# Patient Record
Sex: Male | Born: 1970 | Race: Black or African American | Hispanic: No | Marital: Single | Smoking: Never smoker
Health system: Southern US, Community
[De-identification: ages and names within clinical notes are randomized; demographics above are authoritative.]

## PROBLEM LIST (undated history)

## (undated) DIAGNOSIS — C254 Malignant neoplasm of endocrine pancreas: Secondary | ICD-10-CM

## (undated) DIAGNOSIS — N19 Unspecified kidney failure: Secondary | ICD-10-CM

## (undated) DIAGNOSIS — I10 Essential (primary) hypertension: Secondary | ICD-10-CM

## (undated) DIAGNOSIS — T7840XA Allergy, unspecified, initial encounter: Secondary | ICD-10-CM

## (undated) DIAGNOSIS — E119 Type 2 diabetes mellitus without complications: Secondary | ICD-10-CM

## (undated) DIAGNOSIS — G931 Anoxic brain damage, not elsewhere classified: Secondary | ICD-10-CM

## (undated) DIAGNOSIS — E785 Hyperlipidemia, unspecified: Secondary | ICD-10-CM

## (undated) DIAGNOSIS — C7A8 Other malignant neuroendocrine tumors: Secondary | ICD-10-CM

## (undated) DIAGNOSIS — D490 Neoplasm of unspecified behavior of digestive system: Secondary | ICD-10-CM

## (undated) DIAGNOSIS — L281 Prurigo nodularis: Secondary | ICD-10-CM

## (undated) HISTORY — DX: Allergy, unspecified, initial encounter: T78.40XA

## (undated) HISTORY — PX: EYE SURGERY: SHX253

## (undated) HISTORY — DX: Hyperlipidemia, unspecified: E78.5

---

## 2016-11-27 ENCOUNTER — Encounter (HOSPITAL_COMMUNITY): Payer: Self-pay | Admitting: Emergency Medicine

## 2016-11-27 ENCOUNTER — Emergency Department (HOSPITAL_COMMUNITY)
Admission: EM | Admit: 2016-11-27 | Discharge: 2016-11-28 | Disposition: A | Discharge status: Home / Self Care | Payer: BLUE CROSS/BLUE SHIELD | Attending: Emergency Medicine | Admitting: Emergency Medicine

## 2016-11-27 ENCOUNTER — Emergency Department (HOSPITAL_COMMUNITY): Payer: BLUE CROSS/BLUE SHIELD

## 2016-11-27 DIAGNOSIS — E1165 Type 2 diabetes mellitus with hyperglycemia: Secondary | ICD-10-CM | POA: Diagnosis not present

## 2016-11-27 DIAGNOSIS — R112 Nausea with vomiting, unspecified: Secondary | ICD-10-CM | POA: Insufficient documentation

## 2016-11-27 DIAGNOSIS — D490 Neoplasm of unspecified behavior of digestive system: Secondary | ICD-10-CM | POA: Insufficient documentation

## 2016-11-27 DIAGNOSIS — R748 Abnormal levels of other serum enzymes: Secondary | ICD-10-CM | POA: Diagnosis not present

## 2016-11-27 DIAGNOSIS — D631 Anemia in chronic kidney disease: Secondary | ICD-10-CM

## 2016-11-27 DIAGNOSIS — N186 End stage renal disease: Secondary | ICD-10-CM | POA: Diagnosis not present

## 2016-11-27 DIAGNOSIS — Z79899 Other long term (current) drug therapy: Secondary | ICD-10-CM | POA: Diagnosis not present

## 2016-11-27 DIAGNOSIS — Z794 Long term (current) use of insulin: Secondary | ICD-10-CM | POA: Diagnosis not present

## 2016-11-27 DIAGNOSIS — I120 Hypertensive chronic kidney disease with stage 5 chronic kidney disease or end stage renal disease: Secondary | ICD-10-CM | POA: Diagnosis not present

## 2016-11-27 DIAGNOSIS — Z992 Dependence on renal dialysis: Secondary | ICD-10-CM | POA: Diagnosis not present

## 2016-11-27 DIAGNOSIS — R059 Cough, unspecified: Secondary | ICD-10-CM

## 2016-11-27 DIAGNOSIS — Z659 Problem related to unspecified psychosocial circumstances: Secondary | ICD-10-CM

## 2016-11-27 DIAGNOSIS — R05 Cough: Secondary | ICD-10-CM | POA: Diagnosis not present

## 2016-11-27 DIAGNOSIS — E1122 Type 2 diabetes mellitus with diabetic chronic kidney disease: Secondary | ICD-10-CM | POA: Insufficient documentation

## 2016-11-27 HISTORY — DX: Unspecified kidney failure: N19

## 2016-11-27 HISTORY — DX: Neoplasm of unspecified behavior of digestive system: D49.0

## 2016-11-27 HISTORY — DX: Type 2 diabetes mellitus without complications: E11.9

## 2016-11-27 HISTORY — DX: Essential (primary) hypertension: I10

## 2016-11-27 LAB — CBC WITH DIFFERENTIAL/PLATELET
BASOS PCT: 0 %
Basophils Absolute: 0 10*3/uL (ref 0.0–0.1)
EOS ABS: 0.2 10*3/uL (ref 0.0–0.7)
EOS PCT: 3 %
HCT: 32.5 % — ABNORMAL LOW (ref 39.0–52.0)
Hemoglobin: 10.6 g/dL — ABNORMAL LOW (ref 13.0–17.0)
LYMPHS ABS: 1.8 10*3/uL (ref 0.7–4.0)
Lymphocytes Relative: 24 %
MCH: 26.7 pg (ref 26.0–34.0)
MCHC: 32.6 g/dL (ref 30.0–36.0)
MCV: 81.9 fL (ref 78.0–100.0)
MONOS PCT: 9 %
Monocytes Absolute: 0.7 10*3/uL (ref 0.1–1.0)
NEUTROS PCT: 64 %
Neutro Abs: 4.8 10*3/uL (ref 1.7–7.7)
PLATELETS: 207 10*3/uL (ref 150–400)
RBC: 3.97 MIL/uL — ABNORMAL LOW (ref 4.22–5.81)
RDW: 18.6 % — ABNORMAL HIGH (ref 11.5–15.5)
WBC: 7.5 10*3/uL (ref 4.0–10.5)

## 2016-11-27 LAB — COMPREHENSIVE METABOLIC PANEL
ALBUMIN: 3.2 g/dL — AB (ref 3.5–5.0)
ALT: 15 U/L — ABNORMAL LOW (ref 17–63)
ANION GAP: 15 (ref 5–15)
AST: 15 U/L (ref 15–41)
Alkaline Phosphatase: 336 U/L — ABNORMAL HIGH (ref 38–126)
BUN: 51 mg/dL — ABNORMAL HIGH (ref 6–20)
CHLORIDE: 99 mmol/L — AB (ref 101–111)
CO2: 23 mmol/L (ref 22–32)
Calcium: 7.5 mg/dL — ABNORMAL LOW (ref 8.9–10.3)
Creatinine, Ser: 6.31 mg/dL — ABNORMAL HIGH (ref 0.61–1.24)
GFR calc non Af Amer: 10 mL/min — ABNORMAL LOW (ref >60)
GFR, EST AFRICAN AMERICAN: 11 mL/min — AB (ref >60)
GLUCOSE: 209 mg/dL — AB (ref 65–99)
POTASSIUM: 3.9 mmol/L (ref 3.5–5.1)
SODIUM: 137 mmol/L (ref 135–145)
Total Bilirubin: 0.6 mg/dL (ref 0.3–1.2)
Total Protein: 7 g/dL (ref 6.5–8.1)

## 2016-11-27 LAB — AMMONIA: AMMONIA: 39 umol/L — AB (ref 9–35)

## 2016-11-27 LAB — PROTIME-INR
INR: 1.4
PROTHROMBIN TIME: 17.1 s — AB (ref 11.4–15.2)

## 2016-11-27 LAB — APTT: APTT: 33 s (ref 24–36)

## 2016-11-27 LAB — LIPASE, BLOOD: Lipase: 18 U/L (ref 11–51)

## 2016-11-27 LAB — ETHANOL

## 2016-11-27 MED ORDER — INSULIN DETEMIR 100 UNIT/ML ~~LOC~~ SOLN
10.0000 [IU] | SUBCUTANEOUS | Status: AC
  Administered 2016-11-28: 10 [IU] via SUBCUTANEOUS
  Filled 2016-11-27: qty 0.1

## 2016-11-27 MED ORDER — NIACIN 500 MG PO TABS
500.0000 mg | ORAL_TABLET | Freq: Every day | ORAL | Status: DC
  Administered 2016-11-28: 500 mg via ORAL
  Filled 2016-11-27: qty 1

## 2016-11-27 MED ORDER — INSULIN DETEMIR 100 UNIT/ML ~~LOC~~ SOLN
15.0000 [IU] | SUBCUTANEOUS | Status: DC
  Administered 2016-11-28: 15 [IU] via SUBCUTANEOUS
  Filled 2016-11-27: qty 0.15

## 2016-11-27 MED ORDER — VITAMIN B-1 100 MG PO TABS
100.0000 mg | ORAL_TABLET | Freq: Every day | ORAL | Status: DC
  Administered 2016-11-28: 100 mg via ORAL
  Filled 2016-11-27: qty 1

## 2016-11-27 MED ORDER — OCTREOTIDE ACETATE 100 MCG/ML IJ SOLN
100.0000 ug | Freq: Three times a day (TID) | INTRAMUSCULAR | Status: DC
  Administered 2016-11-28 (×2): 100 ug via SUBCUTANEOUS
  Filled 2016-11-27 (×3): qty 1

## 2016-11-27 MED ORDER — INSULIN DETEMIR 100 UNIT/ML ~~LOC~~ SOLN
10.0000 [IU] | SUBCUTANEOUS | Status: DC
  Filled 2016-11-27: qty 0.1

## 2016-11-27 MED ORDER — AMLODIPINE BESYLATE 5 MG PO TABS
10.0000 mg | ORAL_TABLET | Freq: Every day | ORAL | Status: DC
  Administered 2016-11-28: 10 mg via ORAL
  Filled 2016-11-27: qty 2

## 2016-11-27 MED ORDER — RENA-VITE PO TABS
1.0000 | ORAL_TABLET | Freq: Every day | ORAL | Status: DC
  Administered 2016-11-28: 1 via ORAL
  Filled 2016-11-27: qty 1

## 2016-11-27 MED ORDER — METOPROLOL TARTRATE 25 MG PO TABS
100.0000 mg | ORAL_TABLET | Freq: Two times a day (BID) | ORAL | Status: DC
  Administered 2016-11-28 (×2): 100 mg via ORAL
  Filled 2016-11-27 (×2): qty 4

## 2016-11-27 MED ORDER — CLONIDINE HCL 0.1 MG PO TABS
0.2000 mg | ORAL_TABLET | Freq: Two times a day (BID) | ORAL | Status: DC
  Administered 2016-11-28 (×2): 0.2 mg via ORAL
  Filled 2016-11-27 (×2): qty 2

## 2016-11-27 MED ORDER — PANCRELIPASE (LIP-PROT-AMYL) 12000-38000 UNITS PO CPEP
24000.0000 [IU] | ORAL_CAPSULE | Freq: Three times a day (TID) | ORAL | Status: DC
  Administered 2016-11-28 (×2): 24000 [IU] via ORAL
  Filled 2016-11-27 (×4): qty 2

## 2016-11-27 MED ORDER — CHOLESTYRAMINE 4 G PO PACK
4.0000 g | PACK | Freq: Two times a day (BID) | ORAL | Status: DC
  Administered 2016-11-28 (×2): 4 g via ORAL
  Filled 2016-11-27 (×2): qty 1

## 2016-11-27 MED ORDER — INSULIN ASPART 100 UNIT/ML ~~LOC~~ SOLN
0.0000 [IU] | Freq: Three times a day (TID) | SUBCUTANEOUS | Status: DC
Start: 2016-11-28 — End: 2016-11-28

## 2016-11-27 MED ORDER — PANTOPRAZOLE SODIUM 40 MG PO TBEC
40.0000 mg | DELAYED_RELEASE_TABLET | Freq: Every day | ORAL | Status: DC
  Administered 2016-11-28: 40 mg via ORAL
  Filled 2016-11-27: qty 1

## 2016-11-27 MED ORDER — FA-PYRIDOXINE-CYANOCOBALAMIN 2.5-25-2 MG PO TABS
1.0000 | ORAL_TABLET | Freq: Every day | ORAL | Status: DC
  Administered 2016-11-28: 1 via ORAL
  Filled 2016-11-27: qty 1

## 2016-11-27 MED ORDER — LOSARTAN POTASSIUM 50 MG PO TABS
50.0000 mg | ORAL_TABLET | Freq: Every day | ORAL | Status: DC
  Administered 2016-11-28: 50 mg via ORAL
  Filled 2016-11-27: qty 1

## 2016-11-27 NOTE — ED Provider Notes (Signed)
   Face-to-face evaluation   History: Patient was brought here by his sister, because she was reportedly told to bring him for care because of his debilitated state.  Patient reports he moved to [place] 2 days ago, from [place].  He was able to get dialyzed today.  Physical exam: Debilitated, appears older than stated age.  Marked abdominal distention consistent with ascites.  Bruising and abrasions, knees bilaterally.  Mild dysarthria, nonspecific.  Medical screening examination/treatment/procedure(s) were conducted as a shared visit with non-physician practitioner(s) and myself.  I personally evaluated the patient during the encounter   Daleen Bo, MD 11/27/16 [phone]

## 2016-11-27 NOTE — ED Notes (Signed)
Bed: WA31 Expected date:  Expected time:  Means of arrival:  Comments: 

## 2016-11-27 NOTE — ED Provider Notes (Signed)
[facility] DEPT Provider Note   CSN: [id number] Arrival date & time: 11/27/16  1825     History   Chief Complaint Chief Complaint  Patient presents with  . Dialysis/Placement    HPI Philip West is a 46 y.o. male with a PMHx of DM2, HTN, ESRD on dialysis M/W/F, and pancreatic tumor (unclear, states "no that spot went away"), who presents to the ED unaccompanied apparently for complaints of "I guess I'm homeless". LEVEL 5 CAVEAT DUE TO POOR HISTORIAN AND LACK OF A CAREGIVER. Patient states that he just recently moved here from [place], and just started on dialysis today. He states that he has a hard time getting up the stairs at his sister's house and because of that his dialysis center reportedly threatened to call social services on his sister and less she got him placed in a facility. Patient states that his sister is able to take care of him, but he can't get up the stairs. He uses a walker to get around some times, but usually just uses his wheelchair for ease. He doesn't know where he goes to dialysis because he just started going today. He does not have a primary care doctor here, he was previously being seen by Dr. Antoine Primas (?) in [place]. He states that he did start having a dry cough 2 days ago, and had some nausea and 3 episodes of nonbloody nonbilious emesis earlier today however he denies any ongoing nausea or vomiting. He has not taken any medications for his symptoms, no known aggravating factors. He does not make urine secondary to his ESRD. His sister's name is Armed forces technical officer and her phone number is [phone]. Patient denies any fevers, chills, CP, SOB, abdominal pain, hematemesis, melena, hematochezia, diarrhea, constipation, myalgias, arthralgias, numbness, tingling, focal weakness, or any other complaints at this time. Of note, he states that he always has some abdominal distention, this is not new. Denies having any other medical conditions aside from those listed above,  but it's unclear if his history is accurate as he is a very poor historian.    The history is provided by the patient and medical records. The history is limited by the absence of a caregiver. No language interpreter was used.  Cough  This is a new problem. The current episode started 2 days ago. The problem occurs constantly. The problem has not changed since onset.The cough is non-productive. There has been no fever. Pertinent negatives include no chest pain, no chills, no myalgias and no shortness of breath. He has tried nothing for the symptoms. The treatment provided no relief. He is not a smoker.    Past Medical History:  Diagnosis Date  . Diabetes mellitus without complication ([place])   . Hypertension   . Kidney failure   . Pancreatic tumor     There are no active problems to display for this patient.   History reviewed. No pertinent surgical history.     Home Medications    Prior to Admission medications   Medication Sig Start Date End Date Taking? Authorizing Provider  acetaminophen (TYLENOL) 325 MG tablet Take 650 mg by mouth as directed. Take 30 minutes prior to treatment   Yes [provider]  amLODipine (NORVASC) 10 MG tablet Take 10 mg by mouth daily.   Yes [provider]  b complex-vitamin c-folic acid (NEPHRO-VITE) 0.8 MG TABS tablet Take 1 tablet by mouth daily.   Yes [provider]  cholestyramine (QUESTRAN) 4 g packet Take 4 g by  mouth 2 (two) times daily.   Yes [provider]  cloNIDine (CATAPRES) 0.2 MG tablet Take 0.2 mg by mouth 2 (two) times daily. Hold if sbp less than 110   Yes [provider]  Cyanocobalamin (VITAMIN B 12 PO) Take 1 tablet by mouth daily.   Yes [provider]  folic acid-pyridoxine-cyancobalamin (FOLTX) 2.5-25-2 MG TABS tablet Take 1 tablet by mouth daily.   Yes [provider]  insulin detemir (LEVEMIR) 100 UNIT/ML injection Inject 10-15 Units into the skin 2 (two) times  daily. 15 units at 6 am and 10 units at 6 pm   Yes [provider]  insulin lispro (HUMALOG) 100 UNIT/ML injection Inject 0-12 Units into the skin 2 (two) times daily. Per sliding scale    Yes [provider]  lipase/protease/amylase (CREON) 12000 units CPEP capsule Take 24,000 Units by mouth 3 (three) times daily with meals.   Yes [provider]  losartan (COZAAR) 50 MG tablet Take 50 mg by mouth daily.   Yes [provider]  metoprolol tartrate (LOPRESSOR) 100 MG tablet Take 100 mg by mouth 2 (two) times daily.   Yes [provider]  niacin 500 MG tablet Take 500 mg by mouth at bedtime.   Yes [provider]  octreotide (SANDOSTATIN) 100 MCG/ML SOLN injection Inject 100 mcg into the skin 3 (three) times daily.   Yes [provider]  pantoprazole (PROTONIX) 40 MG tablet Take 40 mg by mouth daily.   Yes [provider]  thiamine (VITAMIN B-1) 100 MG tablet Take 100 mg by mouth daily.   Yes [provider]    Family History History reviewed. No pertinent family history.  Social History Social History  Substance Use Topics  . Smoking status: Never Smoker  . Smokeless tobacco: Never Used  . Alcohol use No     Allergies   Patient has no known allergies.   Review of Systems Review of Systems  Unable to perform ROS: Other  Constitutional: Negative for chills and fever.  Respiratory: Positive for cough. Negative for shortness of breath.   Cardiovascular: Negative for chest pain.  Gastrointestinal: Positive for nausea and vomiting. Negative for abdominal pain, blood in stool, constipation and diarrhea.  Musculoskeletal: Negative for arthralgias and myalgias.  Allergic/Immunologic: Positive for immunocompromised state (DM2).  Neurological: Negative for weakness and numbness.   Level 5 caveat due to poor historian and absence of caregiver    Physical Exam Updated Vital Signs BP (!) 152/125 (BP Location:  Left Arm)   Pulse 90   Temp (!) 97.4 F (36.3 C) (Oral)   Resp 18   Ht 6' (1.829 m)   Wt 86.6 kg (191 lb)   SpO2 100%   BMI 25.90 kg/m   Physical Exam  Constitutional: He is oriented to person, place, and time. Vital signs are normal. He appears well-developed and well-nourished.  Non-toxic appearance. No distress.  Afebrile, nontoxic, NAD, laying in bed; mildly hypertensive  HENT:  Head: Normocephalic and atraumatic.  Mouth/Throat: Oropharynx is clear and moist and mucous membranes are normal.  Eyes: Pupils are equal, round, and reactive to light. Conjunctivae and EOM are normal. Right eye exhibits no discharge. Left eye exhibits no discharge.  PERRL, EOMI, IOLs noted in pupils bilaterally  Neck: Normal range of motion. Neck supple.  Cardiovascular: Normal rate, regular rhythm, normal heart sounds and intact distal pulses.  Exam reveals no gallop and no friction rub.   No murmur heard. Pulmonary/Chest: Effort normal  and breath sounds normal. No respiratory distress. He has no decreased breath sounds. He has no wheezes. He has no rhonchi. He has no rales.  CTAB in all lung fields, no w/r/r, no hypoxia or increased WOB, speaking in full sentences, SpO2 100% on RA R upper chest with dialysis catheter, site appears clean and dry, dressing intact.   Abdominal: Soft. Normal appearance and bowel sounds are normal. He exhibits distension. There is no tenderness. There is no rigidity, no rebound, no guarding, no CVA tenderness, no tenderness at McBurney's point and negative Murphy's sign.  Soft, mildly distended appearing, +BS throughout, with no TTP, no r/g/r, neg murphy's, neg mcburney's, no CVA TTP   Musculoskeletal: Normal range of motion.  MAE x4 Strength and sensation grossly intact in all extremities Distal pulses intact No pedal edema, neg homan's bilaterally   Neurological: He is alert and oriented to person, place, and time. He has normal strength. No sensory deficit.  A&O x3  however hard to tell if his history is accurate; oriented to year but not to month Stutters  Skin: Skin is warm, dry and intact. No rash noted.  Psychiatric: He has a normal mood and affect.  Nursing note and vitals reviewed.    ED Treatments / Results  Labs (all labs ordered are listed, but only abnormal results are displayed) Labs Reviewed  CBC WITH DIFFERENTIAL/PLATELET - Abnormal; Notable for the following:       Result Value   RBC 3.97 (*)    Hemoglobin 10.6 (*)    HCT 32.5 (*)    RDW 18.6 (*)    All other components within normal limits  COMPREHENSIVE METABOLIC PANEL - Abnormal; Notable for the following:    Chloride 99 (*)    Glucose, Bld 209 (*)    BUN 51 (*)    Creatinine, Ser 6.31 (*)    Calcium 7.5 (*)    Albumin 3.2 (*)    ALT 15 (*)    Alkaline Phosphatase 336 (*)    GFR calc non Af Amer 10 (*)    GFR calc Af Amer 11 (*)    All other components within normal limits  AMMONIA - Abnormal; Notable for the following:    Ammonia 39 (*)    All other components within normal limits  PROTIME-INR - Abnormal; Notable for the following:    Prothrombin Time 17.1 (*)    All other components within normal limits  LIPASE, BLOOD  APTT  ETHANOL    EKG  EKG Interpretation  Date/Time:  Tuesday November 27 2016 21:35:58 EDT Ventricular Rate:  92 PR Interval:    QRS Duration: 110 QT Interval:  395 QTC Calculation: 489 R Axis:   145 Text Interpretation:  Sinus rhythm Probable lateral infarct, age indeterminate No old tracing to compare Confirmed by Daleen Bo [phone]) on 11/27/2016 10:24:18 PM       Radiology Dg Abd Acute W/chest  Result Date: 11/27/2016 CLINICAL DATA:  Abdominal distention and cough for 2 days. EXAM: DG ABDOMEN ACUTE W/ 1V CHEST COMPARISON:  None. FINDINGS: Right jugular central line extends to the right atrium. Moderate enlargement of the cardiac silhouette. Small pleural effusions. No airspace consolidation. Supine and left lateral decubitus  views of the abdomen demonstrated a generous volume of air throughout unobstructed bowel. No evidence of bowel obstruction or perforation. No biliary or urinary calculi. IMPRESSION: Enlarged cardiac silhouette. Small pleural effusions. No acute findings in the abdomen. Electronically Signed   By: Valerie Roys.D.  On: 11/27/2016 21:14    Procedures Procedures (including critical care time)  Medications Ordered in ED Medications  amLODipine (NORVASC) tablet 10 mg (not administered)  multivitamin (RENA-VIT) tablet 1 tablet (not administered)  cholestyramine (QUESTRAN) packet 4 g (not administered)  cloNIDine (CATAPRES) tablet 0.2 mg (not administered)  folic acid-pyridoxine-cyancobalamin (FOLTX) 2.5-25-2 MG per tablet 1 tablet (not administered)  insulin detemir (LEVEMIR) injection 10-15 Units (not administered)  lipase/protease/amylase (CREON) capsule 24,000 Units (not administered)  losartan (COZAAR) tablet 50 mg (not administered)  metoprolol tartrate (LOPRESSOR) tablet 100 mg (not administered)  niacin tablet 500 mg (not administered)  octreotide (SANDOSTATIN) injection 100 mcg (not administered)  pantoprazole (PROTONIX) EC tablet 40 mg (not administered)  thiamine (VITAMIN B-1) tablet 100 mg (not administered)  insulin aspart (novoLOG) injection 0-9 Units (not administered)     Initial Impression / Assessment and Plan / ED Course  I have reviewed the triage vital signs and the nursing notes.  Pertinent labs & imaging results that were available during my care of the patient were reviewed by me and considered in my medical decision making (see chart for details).     46 y.o. male here because he states he's homeless because his dialysis center threatened to call social services on his sister due to pt not being able to get up the stairs in her house. Very difficult historian, unable to provide details very well. States he got dialysis today but doesn't know where he gets it  because he just moved here and today was the first day he had dialysis. Reports cough x 2 days and n/v this morning which resolved. A&O x3 however unclear if his history is completely accurate. On exam, no abdominal tenderness but abdomen appears distended which pt states is chronic; lungs clear, mild HTN but otherwise VSS. Attempted to call his sister Philip West at [phone] but she didn't answer; left voice mail to call me back so we can try to gather more information. In the meantime, will attempt to get labs and acute abd series to evaluate for any acute condition. Will reassess shortly.   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 Passed out in February 2018, down for 5 days, found unresponsive and ill; that's when he was diagnosed with this pancreatic issue, and when he started having kidney failure. No CVA at that time.  -Moved here because he wanted to expedite the kidney transplant process -Confirms he has some abd distension ever since February 2018, related to pancreas and kidney issues -States he can't get down stairs, someone has to carry him down -She states he's at his baseline now, as he's been since June, no new illnesses; confirms he had dialysis today at [facility] on [address]. States they were concerned that he can't get down the stairs on his own and threatened to call social services on them unless they brought him to the ER for nursing home placement.  -Confirms he has had a cough for 2 days which she thought was due to her cat and pt having asthma/allergies. She will be available if we need anything further. Advised that we will look for signs of infection/indication for admission, and get social work involved to see what we can do. Will continue to monitor and reassess shortly.   10:32 PM CBC w/diff with anemia, unclear what his baseline is, but likely due to anemia of chronic renal disease. CMP with glucose 209  but no anion gap, Cr  6.31 and BUN 51 again unclear baseline; alk phos 336 but unclear if this is baseline. Lipase WNL. Ammonia level mildly elevated at 39. APTT WNL. INR slightly elevated. EtOH level undetectable. EKG without acute ischemic findings. Acute abd series with enlarged cardiac silhouette and small pleural effusions, but otherwise no acute findings in abdomen (some gas in unobstructed bowel). No medical indication to admit pt, we could send him home with home health nurse eval tomorrow however sister states she does not feel comfortable taking him home and does not feel this solution works. Will try to get in touch with social work, but ultimately may need to have pt stay here overnight and await placement with social work help overnight/tomorrow. Will get ahold of Education officer, museum now.   10:41 PM Shelby with social work returning page, states they'd need to verify his insurance tomorrow and try to get placed to a facility; wants PT/OT consultation done so that we can start that process, and in the morning Erin from social work will follow up with this case and try to get this situation remedied.   11:00 PM Home med orders placed, along with SSI. Patient signed out to overnight provider Dr. Leonides Schanz. Patient history has been discussed with provider resuming care. Please see their notes for further documentation of pending results and dispo/care. Pt stable at sign-out, pending AM social work eval.    Final Clinical Impressions(s) / ED Diagnoses   Final diagnoses:  ESRD on dialysis ([facility])  Anemia due to chronic kidney disease, on chronic dialysis ([place])  Elevated alkaline phosphatase level  Type 2 diabetes mellitus with hyperglycemia, with long-term current use of insulin ([facility])  Nausea and vomiting in adult patient  Cough  Poor social situation    New Prescriptions New Prescriptions   No medications on [address] [place], [place] 11/27/16 2306    Daleen Bo, MD 11/27/16  [phone]

## 2016-11-27 NOTE — ED Notes (Addendum)
CSW consulted for placement @ 6222LN; pt unable to walk and lives with his sister who cannot care for him. It is unclear what ins pt has at this time, facesheet does not indicate any ins. Pt reporting [employer] as his insurance. NP reports pt is walker/wheelchair bound. NP will put in consult for PT/OT Evals.  CSW will continue to follow for DC planning.  Rozanne Heumann B. Joline Maxcy Clinical Social Work Dept Weekend Social Worker [phone] 10:43 PM

## 2016-11-27 NOTE — ED Notes (Signed)
Patient transported to X-ray 

## 2016-11-27 NOTE — ED Triage Notes (Addendum)
Patient is coming to the ED due to he is on dialysis and can not take care of himself. Patient living with sister but her place is not equipped to take care of him. Dialysis center told her today that they were going to call social services on her if she was the one to bring him to dialysis. Sister told them that she is the only one to care for him and they told her to bring him to the hospital to be placed. Patient has dialysis is on Monday, Wednesday, and Friday.

## 2016-11-28 LAB — CBG MONITORING, ED
GLUCOSE-CAPILLARY: 89 mg/dL (ref 65–99)
GLUCOSE-CAPILLARY: 89 mg/dL (ref 65–99)

## 2016-11-28 NOTE — Evaluation (Signed)
Physical Therapy Evaluation Patient Details Name: Philip West MRN: [id number] DOB: April 21, 1970 Today's Date: 11/28/2016   History of Present Illness  46 yo malwe on HD,  Brought to [place] from [place] by his previous caregiver/friend/POA, , two days ago.  Pt flew with [facility] using his wheelchair.  .  Pt's sister states pt was previous living with Darlene's elderly aunt and uncle in their 60s, who dropped the pt regularly, after he was D/C from SNF rehab due to lack of progress with PT/OT.  Sister states she attempted to get all of the information from [facility], finances, plan of care, etc before he came to [facility] but was unable to get any answers.  Chantelle thinks the pt gets LTD from his previous employer but is not sure.  Pt is currently being "put over someone's shoulder" to take him up the stairs to get to sisters 2nd floor apartment  Clinical Impression  The patient is very lethargic. Apparanatly, non ambulatory. Sister came in and transferred patient to [facility]. Reports that he is going to HD. The patient is quite debilitated Pt admitted with above diagnosis. Pt currently with functional limitations due to the deficits listed below (see PT Problem List).  Pt will benefit from skilled PT to increase their independence and safety with mobility to allow discharge to the venue listed below.       Follow Up Recommendations SNF    Equipment Recommendations  None recommended by PT    Recommendations for Other Services       Precautions / Restrictions Precautions Precautions: Fall      Mobility  Bed Mobility Overal bed mobility: Needs Assistance Bed Mobility: Supine to Sit     Supine to sit: Mod assist     General bed mobility comments: much extra time  and multimodal cues  to mobilize to bed edge.  Transfers Overall transfer level: Needs assistance   Transfers: Squat Pivot Transfers     Squat pivot transfers: Max assist     General transfer comment: sister came in and transferred  the patient intomhis WC from the bead with bearhugger approach., no weight noted on the patient's legs.  Ambulation/Gait                Stairs            Wheelchair Mobility    Modified Rankin (Stroke Patients Only)       Balance Overall balance assessment: History of Falls;Needs assistance Sitting-balance support: Feet supported;Bilateral upper extremity supported Sitting balance-Leahy Scale: Poor                                       Pertinent Vitals/Pain Pain Assessment: Faces Faces Pain Scale: No hurt    Home Living Family/patient expects to be discharged to:: Unsure Living Arrangements: Other relatives             Home Equipment: Wheelchair - manual      Prior Function Level of Independence: Needs assistance   Gait / Transfers Assistance Needed: non ambulatory, WC level with assisy           Hand Dominance        Extremity/Trunk Assessment        Lower Extremity Assessment Lower Extremity Assessment: RLE deficits/detail;LLE deficits/detail RLE Deficits / Details: does not bear weight on the legs when transferred    Cervical / Trunk Assessment Cervical / Trunk  Assessment: Kyphotic  Communication   Communication: Other (comment) (minimal responses)  Cognition Arousal/Alertness: Lethargic Behavior During Therapy: Flat affect Overall Cognitive Status: Difficult to assess                                        General Comments      Exercises     Assessment/Plan    PT Assessment Patient needs continued PT services  PT Problem List Decreased strength;Decreased activity tolerance;Decreased range of motion;Decreased balance;Decreased mobility;Decreased knowledge of precautions;Decreased safety awareness;Decreased knowledge of use of DME;Decreased cognition       PT Treatment Interventions Functional mobility training;Therapeutic activities;Patient/family education    PT Goals (Current goals can  be found in the Care Plan section)  Acute Rehab PT Goals Patient Stated Goal: he's going to HD and then I don't know where he'll go. PT Goal Formulation: With family Time For Goal Achievement: 12/12/16 Potential to Achieve Goals: Fair    Frequency Min 2X/week   Barriers to discharge Decreased caregiver support;Inaccessible home environment      Co-evaluation               AM-PAC PT "6 Clicks" Daily Activity  Outcome Measure Difficulty turning over in bed (including adjusting bedclothes, sheets and blankets)?: Unable Difficulty moving from lying on back to sitting on the side of the bed? : Unable Difficulty sitting down on and standing up from a chair with arms (e.g., wheelchair, bedside commode, etc,.)?: Unable Help needed moving to and from a bed to chair (including a wheelchair)?: Total Help needed walking in hospital room?: Total Help needed climbing 3-5 steps with a railing? : Total 6 Click Score: 6    End of Session   Activity Tolerance: Patient limited by fatigue;Patient limited by lethargy Patient left: in chair;with family/visitor present;with nursing/sitter in room Nurse Communication: Mobility status PT Visit Diagnosis: Repeated falls (R29.6)    Time: 1300-1316 PT Time Calculation (min) (ACUTE ONLY): 16 min   Charges:   PT Evaluation $PT Eval Low Complexity: 1 Low     PT G Codes:   PT G-Codes **NOT FOR INPATIENT CLASS** Functional Assessment Tool Used: Clinical judgement;AM-PAC 6 Clicks Basic Mobility Functional Limitation: Mobility: Walking and moving around Mobility: Walking and Moving Around Current Status (B2620): At least 80 percent but less than 100 percent impaired, limited or restricted Mobility: Walking and Moving Around Goal Status [phone]): At least 20 percent but less than 40 percent impaired, limited or restricted    [facility] PT [phone]  Claretha Cooper 11/28/2016, 1:41 PM

## 2016-11-28 NOTE — ED Provider Notes (Addendum)
Well-appearing.  No indication for emergent dialysis now.  Stable vital signs.  Discharged home in the care of family.  Social work and case management involved.  Home health resources including home health social worker.  He'll go to his normal outpatient dialysis center for dialysis.  He'll continue to work with home health about possible placement.  No indication for acute hospitalization.  Medically stable.     Durable Medical Equipment        Start     Ordered   11/28/16 0000  For home use only DME Walker rolling    Question:  Patient needs a walker to treat with the following condition  Answer:  Immobility   11/28/16 1232   11/28/16 0000  For home use only DME 3 n 1     11/28/16 1232   11/28/16 0000  DME Hospital bed    Question Answer Comment  Patient has (list medical condition): ESRD   The above medical condition requires: Patient requires the ability to reposition frequently   Bed type Semi-electric      11/28/16 [place], Thao Bauza, MD 11/28/16 [place], Shelisha Gautier, MD 11/28/16 1232

## 2016-11-28 NOTE — Progress Notes (Signed)
CSW spoke with patients sister, [name] [phone], at length regarding concerns with placement/ home at this time. Patients sister state patient moved to [place] 2 days ago to receive additional care from her and to potentially expedite a kidney transplant. Sister states the social worker at his dialysis center voiced concerns due to patient being unable to walk down her stair- sister has been having someone carry him down the stairs for appointments. Per sister, the social worker at the dialysis center stated she would file a DSS report due patient "not being able to get down the stairs" and "needing to be in long-term care". After this conversation, patients sister brought patient to the ED to assist with placement.   Per patients sister, patient has [place] however this is no indicated on facesheet. Due to patient needing dialysis every M/W/F patient would need to discharge back home with [facility] services. Patient is unable to be transported from ED for dialysis appointments at this time. CSW spoke with EDP regarding patient and current home situation who agrees with decision. EDP spoke with patients sister at length regarding patients need to return home at this time to continue with dialysis and placement can be completed from home. CSW will reach out to RN CM and continue to follow for needs.   Kingsley Spittle, [facility] Emergency Room Clinical Social Worker [phone]

## 2016-11-28 NOTE — Evaluation (Signed)
Occupational Therapy Evaluation Patient Details Name: Philip West MRN: [id number] DOB: 22-Jan-1971 Today's Date: 11/28/2016    History of Present Illness 46 yo male on HD,  Brought to [place] from [place] by his previous caregiver/friend/POA, , two days prior to admission.  Pt flew with [place] using his wheelchair.  .  Pt's sister states pt was previous living with Darlene's elderly aunt and uncle in their 20s, who dropped the pt regularly, after he was D/C from SNF rehab due to lack of progress with PT/OT.  Sister states she attempted to get all of the information from [place], finances, plan of care, etc before he came to [facility] but was unable to get any answers. Pt is currently being "put over someone's shoulder" to take him up the stairs to get to sisters 2nd floor apartment   Clinical Impression   This 46 year old man was admitted for the above. He needed extensive assistance for adls prior to admission.  As noted above, pt was carried down a flight of stairs to get to dialysis.  Will follow pt in acute setting with set up goals for UB, mod A for LB and lateral transfers to drop arm commode.  Pt currently needs min for UB and max to total A for LB ADLs    Follow Up Recommendations  SNF    Equipment Recommendations   (possibly drop arm commode)    Recommendations for Other Services       Precautions / Restrictions Precautions Precautions: Fall Precaution Comments: slow to initiate Restrictions Weight Bearing Restrictions: No      Mobility Bed Mobility Overal bed mobility: Needs Assistance Bed Mobility: Supine to Sit     Supine to sit: Mod assist     General bed mobility comments: much extra time  and multimodal cues  to mobilize to bed edge.  Transfers Overall transfer level: Needs assistance   Transfers: Squat Pivot Transfers     Squat pivot transfers: Max assist     General transfer comment: sister came in and transferred the patient intomhis WC from the bead with  bearhugger approach., no weight noted on the patient's legs.    Balance Overall balance assessment: History of Falls;Needs assistance Sitting-balance support: Feet supported;Bilateral upper extremity supported Sitting balance-Leahy Scale: Poor                                     ADL either performed or assessed with clinical judgement   ADL Overall ADL's : Needs assistance/impaired Eating/Feeding: Independent   Grooming: Set up;Sitting   Upper Body Bathing: Minimal assistance;Sitting   Lower Body Bathing: Maximal assistance;Bed level   Upper Body Dressing : Minimal assistance;Sitting   Lower Body Dressing: Total assistance;Bed level   Toilet Transfer: Maximal assistance;Squat-pivot;+2 for safety/equipment (w/c)   Toileting- Clothing Manipulation and Hygiene: Maximal assistance;Sitting/lateral lean         General ADL Comments: assisted pt to edge of bed and was preparing for transfer to recliner. Sister arrived stating that she was late for picking up pt to take him to dialysis.  She assisted him to w/c with therapist turning his hips.  Pt can be assisted with +1 at bed level for adls; needs +2 for transfers     Vision         Perception     Praxis      Pertinent Vitals/Pain Pain Assessment: No/denies pain Faces Pain Scale:  No hurt     Hand Dominance     Extremity/Trunk Assessment Upper Extremity Assessment Upper Extremity Assessment: Generalized weakness   Lower Extremity Assessment Lower Extremity Assessment: RLE deficits/detail;LLE deficits/detail RLE Deficits / Details: does not bear weight on the legs when transferred   Cervical / Trunk Assessment Cervical / Trunk Assessment: Kyphotic   Communication Communication Communication:  (little verbalization)   Cognition Arousal/Alertness: Lethargic Behavior During Therapy: Flat affect Overall Cognitive Status: Difficult to assess                                 General  Comments: Pt slow to respond/initiate.  Little verbalization   General Comments       Exercises     Shoulder Instructions      Home Living Family/patient expects to be discharged to:: Unsure Living Arrangements: Other relatives                           Home Equipment: Wheelchair - manual          Prior Functioning/Environment Level of Independence: Needs assistance  Gait / Transfers Assistance Needed: non ambulatory, WC level with assisy ADL's / Homemaking Assistance Needed: assist for most adls            OT Problem List: Decreased strength;Decreased activity tolerance;Impaired balance (sitting and/or standing);Decreased knowledge of use of DME or AE;Pain      OT Treatment/Interventions: Self-care/ADL training;Therapeutic exercise;DME and/or AE instruction;Therapeutic activities;Balance training;Patient/family education    OT Goals(Current goals can be found in the care plan section) Acute Rehab OT Goals Patient Stated Goal: he's going to HD and then I don't know where he'll go. OT Goal Formulation: With patient/family Time For Goal Achievement: 12/05/16 Potential to Achieve Goals: Fair ADL Goals Pt Will Transfer to Toilet: with mod assist;bedside commode (lateral transfer) Additional ADL Goal #1: pt will perform UB adls from supported sitting with set up Additional ADL Goal #2: pt will perform LB adls with mod A sit to lean using ae as needed Additional ADL Goal #3: pt will perform 10 reps x 3 shoulder motions AROM to increase strength for adls  OT Frequency: Min 2X/week   Barriers to D/C:            Co-evaluation PT/OT/SLP Co-Evaluation/Treatment: Yes Reason for Co-Treatment: For patient/therapist safety PT goals addressed during session: Mobility/safety with mobility OT goals addressed during session: ADL's and self-care      AM-PAC PT "6 Clicks" Daily Activity     Outcome Measure Help from another person eating meals?: None Help from  another person taking care of personal grooming?: A Little Help from another person toileting, which includes using toliet, bedpan, or urinal?: A Lot Help from another person bathing (including washing, rinsing, drying)?: A Lot Help from another person to put on and taking off regular upper body clothing?: A Little Help from another person to put on and taking off regular lower body clothing?: A Lot 6 Click Score: 16   End of Session    Activity Tolerance:  (limited by appointment) Patient left: in chair;with family/visitor present  OT Visit Diagnosis: Muscle weakness (generalized) (M62.81)                Time: 1300-1316 OT Time Calculation (min): 16 min Charges:  OT General Charges $OT Visit: 1 Visit OT Evaluation $OT Eval Low Complexity: 1 Low G-Codes: OT G-codes **  NOT FOR INPATIENT CLASS** Functional Assessment Tool Used: Clinical judgement Functional Limitation: Self care Self Care Current Status (I3437): At least 80 percent but less than 100 percent impaired, limited or restricted Self Care Goal Status (D5789): At least 40 percent but less than 60 percent impaired, limited or restricted   Lesle Chris, OTR/L [phone] 11/28/2016  Skyy Nilan 11/28/2016, 1:58 PM

## 2016-11-28 NOTE — Care Management Note (Addendum)
Case Management Note  Patient Details  Name: Philip West MRN: [id number] Date of Birth: 07-27-1970  Subjective/Objective:                  46 y.o male with a PMHx of DM2, HTN, ESRD on dialysis M/W/F, and pancreatic tumor brought in by pt's sister, [name] [phone], and current caregiver.    Action/Plan: Pt is in need of a higher level of care and is a poor historian.  Brought to [place] from [place] by his previous caregiver/friend/POA, [place], [phone], two days ago.  Pt flew with [place] using his wheelchair.  No other equipment was brought with pt.  Pt's sister states pt was previous living with Darlene's elderly aunt and uncle in their 19s, who dropped the pt regularly, after he was D/C from SNF rehab due to lack of progress with PT/OT.  Sister states she attempted to get all of the information from [place], finances, plan of care, etc before he came to [facility] but was unable to get any answers.  Chantelle thinks the pt gets LTD from his previous employer but is not sure.  Pt is currently being "put over someone's shoulder" to take him up the stairs to get to sisters 2nd floor apartment.  Sister states she is now unsure if pt is going to stay in [place] or go back to [place].  It appears through that sisters goal is for pt to be placed through PCP and with assistance from [facility] CSW in an appropriate level of care facility, either in [place] or in [place].   Discussed a 3-in-1, hospital bed, and walker for pt's use.  The apartment has carpet, so a hoyer lift would not work in this setting.  Sister states she would like to use [facility] for HHS.  Orders placed for DME and HH RN, PT, OT, NA, and SW.  DME will be delivered today to the home.  Discussed with sister multiple providers that go to pt's home to establish PCP care with.  She will follow up with [employer] to see if any of them are in-network for the pt to start seeing.  Confirmed that Dr Jamal Maes, Internal Medicine - Nephrologist, will sign HHS orders until  pt can establish a PCP.  All information placed on AVS and fully discussed with Chantelle.  No further CM needs noted at this time.  Expected Discharge Date:    11/28/2016              Expected Discharge Plan:  [place]  In-House Referral:  Clinical Social Work  Discharge planning Services  CM Consult  Post Acute Care Choice:  Durable Medical Equipment, Home Health Choice offered to:  Patient, Sibling  DME Arranged:  3-N-1, Hospital bed, Environmental consultant DME Agency:  [place] Arranged:  RN, PT, OT, Nurse's Aide, Social Work [employer] Agency:  [place]  Status of Service:  Completed, signed off  Colletta Spillers, Benjaman Lobe, RN 11/28/2016, 12:22 PM

## 2016-11-28 NOTE — Care Management Note (Signed)
Case Management Note  CM noted PT eval go in to see pt just before time of D/C Pt's sister arrived during their evaluation.  CM was told by PT that sister stated after she took pt to dialysis she was advised by [place] to go to [facility] ED which is where they said she should have gone yesterday.  Advised sister to give CM a minute to speak with Dr. Venora Maples.  Dr Venora Maples and CM spoke with pt and sister advising her that the outcome would be no different if she went to [facility] ED after dialysis.  This was not a quick fix to figure out placement for the pt.  Discussed medicare/medicaid, the need to figure out where pt is getting his money and if there is an end date to that amount or if there is a possibility to get it in a lump sum, etc.  Advised that all of theses things are going to take time and the HHS CSW will be able to help them through this process.  She additionally needs to talk at further length with POA, Darlene, and to decide if pt is going back to [place] or staying in [place].  Advised she could also consult an elder law attorney to help navigate finances/medicaid.  Pt sister was understandably upset but understood more clearly that going to [facility] ED later today would not change anything.  CM will reach out to the dialysis center to clarify with them the plan of care.

## 2016-12-11 ENCOUNTER — Telehealth: Payer: Self-pay

## 2016-12-11 ENCOUNTER — Encounter: Payer: Self-pay | Admitting: Family Medicine

## 2016-12-11 ENCOUNTER — Ambulatory Visit (INDEPENDENT_AMBULATORY_CARE_PROVIDER_SITE_OTHER): Payer: BLUE CROSS/BLUE SHIELD | Admitting: Family Medicine

## 2016-12-11 VITALS — BP 120/90 | HR 78

## 2016-12-11 DIAGNOSIS — Z992 Dependence on renal dialysis: Secondary | ICD-10-CM | POA: Diagnosis not present

## 2016-12-11 DIAGNOSIS — I10 Essential (primary) hypertension: Secondary | ICD-10-CM | POA: Diagnosis not present

## 2016-12-11 DIAGNOSIS — E785 Hyperlipidemia, unspecified: Secondary | ICD-10-CM

## 2016-12-11 DIAGNOSIS — I152 Hypertension secondary to endocrine disorders: Secondary | ICD-10-CM | POA: Insufficient documentation

## 2016-12-11 DIAGNOSIS — N186 End stage renal disease: Secondary | ICD-10-CM | POA: Diagnosis not present

## 2016-12-11 DIAGNOSIS — Z114 Encounter for screening for human immunodeficiency virus [HIV]: Secondary | ICD-10-CM

## 2016-12-11 DIAGNOSIS — C254 Malignant neoplasm of endocrine pancreas: Secondary | ICD-10-CM

## 2016-12-11 DIAGNOSIS — E1159 Type 2 diabetes mellitus with other circulatory complications: Secondary | ICD-10-CM | POA: Diagnosis not present

## 2016-12-11 DIAGNOSIS — G931 Anoxic brain damage, not elsewhere classified: Secondary | ICD-10-CM

## 2016-12-11 DIAGNOSIS — E1122 Type 2 diabetes mellitus with diabetic chronic kidney disease: Secondary | ICD-10-CM | POA: Diagnosis not present

## 2016-12-11 DIAGNOSIS — E1169 Type 2 diabetes mellitus with other specified complication: Secondary | ICD-10-CM

## 2016-12-11 LAB — LIPID PANEL
CHOLESTEROL: 106 mg/dL (ref 0–200)
HDL: 47.8 mg/dL (ref >39.00)
LDL CALC: 45 mg/dL (ref 0–99)
NONHDL: 57.74
Total CHOL/HDL Ratio: 2
Triglycerides: 63 mg/dL (ref 0.0–149.0)
VLDL: 12.6 mg/dL (ref 0.0–40.0)

## 2016-12-11 LAB — CBC
HCT: 37.7 % — ABNORMAL LOW (ref 39.0–52.0)
Hemoglobin: 11.8 g/dL — ABNORMAL LOW (ref 13.0–17.0)
MCHC: 31.4 g/dL (ref 30.0–36.0)
MCV: 87.4 fl (ref 78.0–100.0)
Platelets: 93 10*3/uL — ABNORMAL LOW (ref 150.0–400.0)
RBC: 4.32 Mil/uL (ref 4.22–5.81)
RDW: 20.7 % — ABNORMAL HIGH (ref 11.5–15.5)
WBC: 5.4 10*3/uL (ref 4.0–10.5)

## 2016-12-11 LAB — COMPREHENSIVE METABOLIC PANEL
ALT: 19 U/L (ref 0–53)
AST: 17 U/L (ref 0–37)
Albumin: 3.5 g/dL (ref 3.5–5.2)
Alkaline Phosphatase: 309 U/L — ABNORMAL HIGH (ref 39–117)
BILIRUBIN TOTAL: 0.7 mg/dL (ref 0.2–1.2)
BUN: 38 mg/dL — ABNORMAL HIGH (ref 6–23)
CO2: 23 meq/L (ref 19–32)
CREATININE: 5.81 mg/dL — AB (ref 0.40–1.50)
Calcium: 8.3 mg/dL — ABNORMAL LOW (ref 8.4–10.5)
Chloride: 102 mEq/L (ref 96–112)
GFR: 13.53 mL/min — CL (ref >60.00)
GLUCOSE: 170 mg/dL — AB (ref 70–99)
Potassium: 3 mEq/L — ABNORMAL LOW (ref 3.5–5.1)
SODIUM: 138 meq/L (ref 135–145)
Total Protein: 7 g/dL (ref 6.0–8.3)

## 2016-12-11 LAB — HEMOGLOBIN A1C: HEMOGLOBIN A1C: 8.3 % — AB (ref 4.6–6.5)

## 2016-12-11 MED ORDER — PANCRELIPASE (LIP-PROT-AMYL) 12000-38000 UNITS PO CPEP: 24000.0000 [IU] | ORAL_CAPSULE | Freq: Three times a day (TID) | ORAL | 11 refills | Status: DC

## 2016-12-11 MED ORDER — INSULIN DETEMIR 100 UNIT/ML ~~LOC~~ SOLN: 10.0000 [IU] | Freq: Two times a day (BID) | SUBCUTANEOUS | 11 refills | Status: DC

## 2016-12-11 MED ORDER — CHOLESTYRAMINE 4 G PO PACK: 4.0000 g | PACK | Freq: Two times a day (BID) | ORAL | 11 refills | Status: DC

## 2016-12-11 MED ORDER — VITAMIN B 12 100 MCG PO LOZG: LOZENGE | ORAL | 3 refills | Status: DC

## 2016-12-11 MED ORDER — FA-PYRIDOXINE-CYANOCOBALAMIN 2.5-25-2 MG PO TABS: 1.0000 | ORAL_TABLET | Freq: Every day | ORAL | 11 refills | Status: AC

## 2016-12-11 MED ORDER — NIACIN 500 MG PO TABS: 500.0000 mg | ORAL_TABLET | Freq: Every day | ORAL | 11 refills | Status: DC

## 2016-12-11 MED ORDER — LOPERAMIDE HCL 2 MG PO TABS: 2.0000 mg | ORAL_TABLET | Freq: Four times a day (QID) | ORAL | 11 refills | Status: AC | PRN

## 2016-12-11 MED ORDER — VITAMIN B-1 100 MG PO TABS: 100.0000 mg | ORAL_TABLET | Freq: Every day | ORAL | 11 refills | Status: AC

## 2016-12-11 MED ORDER — PANTOPRAZOLE SODIUM 40 MG PO TBEC: 40.0000 mg | DELAYED_RELEASE_TABLET | Freq: Every day | ORAL | 11 refills | Status: DC

## 2016-12-11 MED ORDER — NEPHRO-VITE 0.8 MG PO TABS: 1.0000 | ORAL_TABLET | Freq: Every day | ORAL | 11 refills | Status: AC

## 2016-12-11 NOTE — Assessment & Plan Note (Signed)
Check lipid panel today.  Given significant comorbidities would likely not start statin unless significantly elevated LDL.

## 2016-12-11 NOTE — Patient Instructions (Signed)
Stop all the blood pressure medications. His blood pressure should be 130/90 or lower.  Please restart levemir 10 units daily. Let me know if his sugar drop too low or if they are persistently elevated above 200.  Please restart his other medications.  I sent in a referral to endocrinology and GI.  Come back to see me few weeks.  Take care,  Dr [name]

## 2016-12-11 NOTE — Assessment & Plan Note (Addendum)
Refilled patient's Creon and cholestyramine.  Will send in prescription for Protonix.  Referral to GI placed to establish care.

## 2016-12-11 NOTE — Telephone Encounter (Signed)
Lab called with critical result on pt. Critical creatinine of 5.81 and GFR 13.53. Notified Jari Sportsman, [place]. She will notify Dr. [name].

## 2016-12-11 NOTE — Assessment & Plan Note (Signed)
At goal today off all medications.  We will not continue medications at this point.  Recheck at follow-up in 2-3 weeks.

## 2016-12-11 NOTE — Assessment & Plan Note (Signed)
Patient with deficits including generalized weakness and dysarthria.  Will obtain prior brain MRI records.  Family currently looking into nursing home placement.

## 2016-12-11 NOTE — Progress Notes (Signed)
Subjective:  Philip West is a 46 y.o. male who presents today with a chief complaint of diarrhea and to establish care.   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  He has seen GI specialists for this and was on Creon, cholestyramine, and octreotide.  He was also on Protonix. Recently relocated to [facility] about 3 weeks ago and has not been on any medications.  He has had significant worsening of his diarrhea.  He has been using Imodium as needed which has helped some.  No nausea or vomiting.  No fevers or chills.  Anoxic brain injury, chronic issue, new to this provider Per patient's sister, patient suffered an episode about 8 months ago in which she was found down in his place of living.  He was reportedly down for a few days before being found.  This was related to his diabetes.  Since then he has been "not the same."  Sister reports that he had an MRI just prior to leaving [place] however she is not sure of the results.  She has noticed that he has difficulty speaking and is now essentially bedridden.  They are seeking placement into a nursing facility.  HTN, issue, new this provider Patient has been off all medications for approximately 3 weeks.  Was previously on Norvasc 10 mg daily, clonidine 0.2 mg twice daily, losartan 50 mg daily, and metoprolol 100 mg twice daily.  No chest pain or shortness of breath.  No lower extremity edema.  Type 2 diabetes, chronic issue, new to this provider Patient has also been off all of his insulin for approximately 3 weeks.  Patient sister and patient himself are not clear on his exact regimen.  I know he was on a basal insulin and a sliding scale short acting insulin as needed.  Patient's sister reports that his sugars have generally been under 200 for the past few weeks.  He has been managed by endocrinology in the past.  ESRD, chronic issue, new to this provider This  was thought to be related to his diabetes and the episode he suffered approximately 8 months ago.  He has dialysis Monday Wednesday and Friday.  Sees Dr. Lorrene Reid.  ROS: Per HPI, otherwise a 14 point review of systems was performed and was negative  PMH:  The following were reviewed and entered/updated in epic: Past Medical History:  Diagnosis Date  . Allergy   . Diabetes mellitus without complication ([place])   . Hyperlipidemia   . Hypertension   . Kidney failure   . Pancreatic tumor    Patient Active Problem List   Diagnosis Date Noted  . ESRD (end stage renal disease) on dialysis ([place]) 12/11/2016  . Vipoma ([place]) 12/11/2016  . Anoxic brain injury ([place]) 12/11/2016  . Diabetes mellitus with ESRD (end-stage renal disease) ([place])   . Hyperlipidemia associated with type 2 diabetes mellitus ([place])   . Hypertension associated with diabetes ([place])    History reviewed. No pertinent surgical history.  Family History  Problem Relation Age of Onset  . Alcohol abuse Mother   . Depression Mother   . Early death Mother   . Asthma Sister   . Depression Sister   . Depression Daughter   . Arthritis Maternal Grandmother   . Diabetes Maternal Grandmother   . Heart disease Maternal Grandmother   . Hearing loss Maternal Grandmother     Medications- reviewed and updated Current Outpatient  Prescriptions  Medication Sig Dispense Refill  . b complex-vitamin c-folic acid (NEPHRO-VITE) 0.8 MG TABS tablet Take 1 tablet by mouth daily. 30 tablet 11  . cholestyramine (QUESTRAN) 4 g packet Take 1 packet (4 g total) by mouth 2 (two) times daily. 60 each 11  . Cyanocobalamin (VITAMIN B 12) 100 MCG LOZG Take 1 by mouth daily 267 lozenge 3  . folic acid-pyridoxine-cyancobalamin (FOLTX) 2.5-25-2 MG TABS tablet Take 1 tablet by mouth daily. 30 each 11  . insulin detemir (LEVEMIR) 100 UNIT/ML injection Inject 0.1-0.15 mLs (10-15 Units total) into the skin 2 (two) times daily. 15 units at 6 am and 10 units at 6 pm  10 mL 11  . insulin lispro (HUMALOG) 100 UNIT/ML injection Inject 0-12 Units into the skin 2 (two) times daily. Per sliding scale     . lipase/protease/amylase (CREON) 12000 units CPEP capsule Take 2 capsules (24,000 Units total) by mouth 3 (three) times daily with meals. 270 capsule 11  . niacin 500 MG tablet Take 1 tablet (500 mg total) by mouth at bedtime. 30 tablet 11  . octreotide (SANDOSTATIN) 100 MCG/ML SOLN injection Inject 100 mcg into the skin 3 (three) times daily.    . pantoprazole (PROTONIX) 40 MG tablet Take 1 tablet (40 mg total) by mouth daily. 30 tablet 11  . thiamine (VITAMIN B-1) 100 MG tablet Take 1 tablet (100 mg total) by mouth daily. 30 tablet 11  . loperamide (IMODIUM A-D) 2 MG tablet Take 1 tablet (2 mg total) by mouth 4 (four) times daily as needed for diarrhea or loose stools. 30 tablet 11   No current facility-administered medications for this visit.     Allergies-reviewed and updated No Known Allergies  Social History   Social History  . Marital status: Single    Spouse name: N/A  . Number of children: N/A  . Years of education: N/A   Social History Main Topics  . Smoking status: Never Smoker  . Smokeless tobacco: Never Used  . Alcohol use No  . Drug use: No  . Sexual activity: No   Other Topics Concern  . None   Social History Narrative  . None   Objective:  Physical Exam: BP 120/90   Pulse 78   SpO2 99%   Gen: NAD, resting comfortably in wheelchair CV: RRR with no murmurs appreciated Pulm: NWOB, CTAB with no crackles, wheezes, or rhonchi GI: Normal bowel sounds present. Soft, Nontender, Nondistended. MSK: No edema, cyanosis, or clubbing noted Skin: Warm, dry Neuro: In wheelchair, moves all extremities.  Dysarthria noted. No other gross abnormalities. 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  Will send in prescription for Protonix.  Referral to GI placed to  establish care.  Anoxic brain injury [facility]) Patient with deficits including generalized weakness and dysarthria.  Will obtain prior brain MRI records.  Family currently looking into nursing home placement.  Hypertension associated with diabetes ([place]) At goal today off all medications.  We will not continue medications at this point.  Recheck at follow-up in 2-3 weeks.  Diabetes mellitus with ESRD (end-stage renal disease) ([facility]) Restart Levemir 10 units daily.  Titrate to goal fasting sugars less than 150 at this point.  Follow-up in 2-3 weeks.  Referral to endocrinology placed.  Check A1c today.  Hyperlipidemia associated with type 2 diabetes mellitus ([place]) Check lipid panel today.  Given significant comorbidities would likely not start statin unless significantly elevated LDL.  ESRD (  end stage renal disease) on dialysis ([place]) No signs of volume overload today.  Check CMET.  Continue Monday Wednesday Friday schedule.  Time Spent: I spent 60 minutes face-to-face with the patient, with more than half spent on counseling for the above issues including medication reconciliation, treatment of his VIPoma and T2DM, screening blood work, and coordinating care with his dialysis center and possible nursing home placement.   Algis Greenhouse. [name], MD 12/11/2016 12:16 PM

## 2016-12-11 NOTE — Assessment & Plan Note (Signed)
Restart Levemir 10 units daily.  Titrate to goal fasting sugars less than 150 at this point.  Follow-up in 2-3 weeks.  Referral to endocrinology placed.  Check A1c today.

## 2016-12-11 NOTE — Telephone Encounter (Signed)
Dr. [name] is aware of results.

## 2016-12-11 NOTE — Assessment & Plan Note (Signed)
No signs of volume overload today.  Check CMET.  Continue Monday Wednesday Friday schedule.

## 2016-12-12 LAB — HIV ANTIBODY (ROUTINE TESTING W REFLEX): HIV 1&2 Ab, 4th Generation: NONREACTIVE

## 2016-12-13 ENCOUNTER — Telehealth: Payer: Self-pay | Admitting: Family Medicine

## 2016-12-13 NOTE — Telephone Encounter (Signed)
Patient has been evaluated.  Requesting verbal orders to continue treating for 2x a week for 3 weeks and 1 time a week for 1 week.  Ty,  -LL

## 2016-12-13 NOTE — Telephone Encounter (Signed)
Okay for verbal orders. 

## 2016-12-13 NOTE — Telephone Encounter (Signed)
Calling to check on status of gastro and endo referrals. I Also wonders if she could get more help on getting her brother put into long term care facility. Patient states that he was denied because there was not enough information on the form that they received.

## 2016-12-13 NOTE — Telephone Encounter (Signed)
Ok by me.  Philip West. [name], MD 12/13/2016 1:09 PM

## 2016-12-14 ENCOUNTER — Encounter: Payer: Self-pay | Admitting: Gastroenterology

## 2016-12-14 NOTE — Addendum Note (Signed)
Addended by: Vivi Barrack on: 12/14/2016 01:48 PM   Modules accepted: Orders

## 2016-12-14 NOTE — Telephone Encounter (Signed)
Philip West is aware via voicemail of verbal orders.

## 2016-12-19 NOTE — Telephone Encounter (Signed)
Both referrals are placed and put into appropriate workques. GI has been scheduled, and Endo will call patient to contact and schedule. I cannot speak on the long term care facility without knowing which agency or who it was. That was not completed by me.

## 2016-12-27 ENCOUNTER — Encounter: Payer: Self-pay | Admitting: Gastroenterology

## 2016-12-27 ENCOUNTER — Ambulatory Visit (INDEPENDENT_AMBULATORY_CARE_PROVIDER_SITE_OTHER): Payer: BLUE CROSS/BLUE SHIELD | Admitting: Gastroenterology

## 2016-12-27 VITALS — BP 116/78 | HR 72 | Ht 72.0 in | Wt 191.0 lb

## 2016-12-27 DIAGNOSIS — C254 Malignant neoplasm of endocrine pancreas: Secondary | ICD-10-CM

## 2016-12-27 DIAGNOSIS — R197 Diarrhea, unspecified: Secondary | ICD-10-CM

## 2016-12-27 MED ORDER — PANCRELIPASE (LIP-PROT-AMYL) 24000-76000 UNITS PO CPEP: ORAL_CAPSULE | ORAL | 1 refills | Status: DC

## 2016-12-27 NOTE — Patient Instructions (Addendum)
Take Imodium 2 mg every 6 hours as needed.   Take Creon 24,000 take 2 tablets 3 times daily ( 6 tabs daily). Sent prescription to [facility].    You have been scheduled for a CT scan of the abdomen and pelvis at [facility] ([address]---this is in the same building as Press photographer).   You are scheduled on Tuesday 11-13 at 4:00 PM. You should arrive 15 minutes prior to your appointment time for registration. Please follow the written instructions below on the day of your exam:  WARNING: IF YOU ARE ALLERGIC TO IODINE/X-RAY DYE, PLEASE NOTIFY RADIOLOGY IMMEDIATELY AT [phone]! YOU WILL BE GIVEN A 13 HOUR PREMEDICATION PREP.  1) Do not eat or drink anything after 12:00 am(4 hours prior to your test) 2) You have been given 2 bottles of oral contrast to drink. The solution may taste               better if refrigerated, but do NOT add ice or any other liquid to this solution. Shake             well before drinking.    Drink 1 bottle of contrast @ 2:00 Pm(2 hours prior to your exam)  Drink 1 bottle of contrast @ 3:00 PM (1 hour prior to your exam)  You may take any medications as prescribed with a small amount of water except for the following: Metformin, Glucophage, Glucovance, Avandamet, Riomet, Fortamet, Actoplus Met, Janumet, Glumetza or Metaglip. The above medications must be held the day of the exam AND 48 hours after the exam.  The purpose of you drinking the oral contrast is to aid in the visualization of your intestinal tract. The contrast solution may cause some diarrhea. Before your exam is started, you will be given a small amount of fluid to drink. Depending on your individual set of symptoms, you may also receive an intravenous injection of x-ray contrast/dye. Plan on being at [facility] for 30 minutes or long, depending on the type of exam you are having performed.  If you have any questions regarding your exam or if you need to reschedule, you  may call the CT department at [phone] between the hours of 8:00 am and 5:00 pm, Monday-Friday.  ________________________________________________________________________

## 2016-12-27 NOTE — Progress Notes (Addendum)
12/27/2016 Philip West [id number] 07/13/1970   HISTORY OF PRESENT ILLNESS: This is a 46 year old male who is new to our office and was referred here by Dr. [name], for treatment of his diarrhea.  The patient is here today with his sister.  Apparently he moved here about 3 or 4 weeks ago to stay with her for short time.  They anticipate that he will only be here about another month or 2 before returning to [place].  Anyway, back in February he was found down at home after what was suspected to be a few days, which resulted in an anoxic brain injury.  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  He tells me that he had severe diarrhea for 10 years and then was finally diagnosed at this earlier this year via serum levels.  He was being treated with Creon 24000 units TID with meals, Imodium, Questran one packet twice daily, and octreotide.  They are here today to obtain some prescriptions for the medications.  We absolutely have no records from his previous evaluation or treatment.  I discussed with the patient and his sister and informed them that if he were to remain in this area longer than 1-2 months then he would need to be referred to a tertiary care facility in order to follow and treat this more long-term.  She would like some type of imaging performed as she does not think that he was ever able to the the CT scan that they wanted performed while he was in [place] because of several other issues including his renal failure and need for dialysis, which he started in June.   Past Medical History:  Diagnosis Date  . Allergy   . Diabetes mellitus without complication ([place])   . Hyperlipidemia   . Hypertension   . Kidney failure   . Pancreatic tumor    Past Surgical History:  Procedure Laterality Date  . EYE SURGERY Left     reports that  has never smoked. he has never used smokeless tobacco. He reports that he does not drink alcohol or use drugs. family history includes Alcohol  abuse in his mother; Arthritis in his maternal grandmother; Asthma in his sister; Depression in his daughter, mother, and sister; Diabetes in his maternal grandmother; Early death in his mother; Hearing loss in his maternal grandmother; Heart disease in his maternal grandmother. No Known Allergies    Outpatient Encounter Medications as of 12/27/2016  Medication Sig  . b complex-vitamin c-folic acid (NEPHRO-VITE) 0.8 MG TABS tablet Take 1 tablet by mouth daily.  . cholestyramine (QUESTRAN) 4 g packet Take 1 packet (4 g total) by mouth 2 (two) times daily.  . Cyanocobalamin (VITAMIN B 12) 100 MCG LOZG Take 1 by mouth daily  . folic acid-pyridoxine-cyancobalamin (FOLTX) 2.5-25-2 MG TABS tablet Take 1 tablet by mouth daily.  . insulin detemir (LEVEMIR) 100 UNIT/ML injection Inject 0.1-0.15 mLs (10-15 Units total) into the skin 2 (two) times daily. 15 units at 6 am and 10 units at 6 pm  . insulin lispro (HUMALOG) 100 UNIT/ML injection Inject 0-12 Units into the skin 2 (two) times daily. Per sliding scale   . lipase/protease/amylase (CREON) 12000 units CPEP capsule Take 2 capsules (24,000 Units total) by mouth 3 (three) times daily with meals.  Marland Kitchen loperamide (IMODIUM A-D) 2 MG tablet Take 1 tablet (2 mg total) by mouth 4 (four) times daily as needed for diarrhea or loose stools.  . niacin 500  MG tablet Take 1 tablet (500 mg total) by mouth at bedtime.  Marland Kitchen octreotide (SANDOSTATIN) 100 MCG/ML SOLN injection Inject 100 mcg into the skin 3 (three) times daily.  . pantoprazole (PROTONIX) 40 MG tablet Take 1 tablet (40 mg total) by mouth daily.  Marland Kitchen thiamine (VITAMIN B-1) 100 MG tablet Take 1 tablet (100 mg total) by mouth daily.  . Pancrelipase, Lip-Prot-Amyl, (CREON) 24000-76000 units CPEP Take 2 capsules 3 times daily ( 6 tablets daily)   No facility-administered encounter medications on file as of 12/27/2016.      REVIEW OF SYSTEMS  : All other systems reviewed and negative except where noted in the  History of Present Illness.   PHYSICAL EXAM: BP 116/78   Pulse 72   Ht 6' (1.829 m)   Wt 191 lb (86.6 kg)   BMI 25.90 kg/m  General: Well developed black male in no acute distress Head: Normocephalic and atraumatic Eyes:  Sclerae anicteric, conjunctiva pink. Ears: Normal auditory acuity Lungs: Clear throughout to auscultation; no increased WOB. Heart: Regular rate and rhythm; no M/R/G. Abdomen: Soft, non-distended.  BS present.  Non-tender. Musculoskeletal: Symmetrical with no gross deformities  Skin: No lesions on visible extremities Extremities: No edema  Neurological: Alert oriented x 4, grossly non-focal Psychological:  Alert and cooperative. Normal mood and affect  ASSESSMENT AND PLAN: *46 year old male with years of severe diarrhea.  Apparently diagnosed with VIPoma earlier this year in [place].  We do not have any records from there and the plan is for him to only be here temporarily, maybe another month or two.  He is on Creon, questran, octreotide, and Imodium for his diarrhea.  We will give him prescriptions for his Creon (on which I am also going to increase his dose to 48000 with meals instead of 99357 with meals) and his Imodium for one month with one refill.  If they determine that he is going to be here longer than expected then he will need to be followed at a tertiary center such as [facility].  We are going to try to obtain some records.  His sister is requesting a CT scan as she is unsure that he's actually ever had any imaging.  Says that he was diagnosed with serum levels. *ESRD on HD MWF *Anoxic brain injury  **EGD and colonoscopy records received.  Both performed by Dr. Shauna Hugh in 06/2015 at which time EGD showed erythema in stomach, normal duodenum.  Gastric biopsies showed mild chronic gastritis, no HP.  Duodenal biopsies were normal.  Colonoscopy showed small internal hemorrhoids.  There was a fair amount of stool throughout the colon so they were considering  constipation with paradoxical diarrhea as cause of diarrhea at that time.  Random colon biopsies were normal.   CC:  Vivi Barrack, MD

## 2016-12-28 NOTE — Progress Notes (Signed)
Reviewed and agree with initial management plan. Care plans were discussed with Ms. Philip West at the time of the office visit.   Pricilla Riffle. Fuller Plan, MD [facility]

## 2017-01-01 ENCOUNTER — Ambulatory Visit (INDEPENDENT_AMBULATORY_CARE_PROVIDER_SITE_OTHER)
Admission: RE | Admit: 2017-01-01 | Discharge: 2017-01-01 | Disposition: A | Discharge status: Home / Self Care | Payer: BLUE CROSS/BLUE SHIELD | Source: Ambulatory Visit | Attending: Gastroenterology | Admitting: Gastroenterology

## 2017-01-01 DIAGNOSIS — C254 Malignant neoplasm of endocrine pancreas: Secondary | ICD-10-CM | POA: Diagnosis not present

## 2017-01-01 DIAGNOSIS — R197 Diarrhea, unspecified: Secondary | ICD-10-CM

## 2017-01-02 ENCOUNTER — Ambulatory Visit (INDEPENDENT_AMBULATORY_CARE_PROVIDER_SITE_OTHER): Payer: BLUE CROSS/BLUE SHIELD | Admitting: Family Medicine

## 2017-01-02 ENCOUNTER — Encounter: Payer: Self-pay | Admitting: Family Medicine

## 2017-01-02 VITALS — BP 140/100 | HR 92

## 2017-01-02 DIAGNOSIS — N186 End stage renal disease: Secondary | ICD-10-CM | POA: Diagnosis not present

## 2017-01-02 DIAGNOSIS — R229 Localized swelling, mass and lump, unspecified: Secondary | ICD-10-CM

## 2017-01-02 DIAGNOSIS — L281 Prurigo nodularis: Secondary | ICD-10-CM

## 2017-01-02 DIAGNOSIS — Z7401 Bed confinement status: Secondary | ICD-10-CM | POA: Diagnosis not present

## 2017-01-02 DIAGNOSIS — E1122 Type 2 diabetes mellitus with diabetic chronic kidney disease: Secondary | ICD-10-CM | POA: Diagnosis not present

## 2017-01-02 DIAGNOSIS — E1159 Type 2 diabetes mellitus with other circulatory complications: Secondary | ICD-10-CM

## 2017-01-02 DIAGNOSIS — I10 Essential (primary) hypertension: Secondary | ICD-10-CM

## 2017-01-02 NOTE — Patient Instructions (Signed)
No changes today.  Come back after you have a few weeks of blood sugars for review.  Take care,  Dr [name]

## 2017-01-02 NOTE — Assessment & Plan Note (Signed)
No signs of infection.  Patient does not need wound care.  Advised him to avoid scratching these areas.  Discussed warning signs including pain, purulence, fluctuance, and spreading erythema.

## 2017-01-02 NOTE — Progress Notes (Signed)
   Subjective:  Philip West is a 46 y.o. male who presents today with a chief complaint of HTN follow up.   HPI:  Hypertension, Chronic Problem, Stable BP Readings from Last 3 Encounters:  01/02/17 (!) 140/100  12/27/16 116/78  12/11/16 120/90   Home BP monitoring: None Current Medications: None Interim History: Sister reports that it has been well controlled at HD.  ROS: Denies any chest pain, shortness of breath, dyspnea on exertion, leg edema.   DIABETES Type II, Chronic, Uncontrolled Medications: Levemir 10 units daily, Reports taking and tolerating without side effects. Blood Sugars per patient: Not checking blood sugars.  Regular Exercise-None Interim History- Patient seen about 3 weeks ago and restarted levemir 10units daily. They were instructed to titrate to fasting blood sugars of less than 150. This has not been done.   ROS: Hypoglycemia symptoms (palpitations, tremors, anxiousness)   Skin Lesions, New Issue Patient's sister is also concerned about lesions on the patient's legs. States that they are currently looking into having him placed into a facility back in [place] and they are wondering if he needs wound care or not. Patient has dry skin and requently picks at his skin. No fevers. No chills. No purulence or other drainage.   ROS: Per HPI  PMH: Smoking history reviewed. Never smoker.   Objective:  Physical Exam: BP (!) 140/100   Pulse 92   SpO2 98%   Gen: NAD, resting comfortably in wheelchair CV: RRR with no murmurs appreciated Pulm: NWOB, CTAB with no crackles, wheezes, or rhonchi MSK: Lower extremities with several excoriated nodules consistent with picker's nodules.  No erythema, fluctuance or purulence.  Foot exam performed (see flow sheet for details).  Assessment/Plan:  Hypertension associated with diabetes ([place]) Mildly above goal today.  Previously well controlled.  No indication to start medication today.  Continue to watch.     Diabetes mellitus with ESRD (end-stage renal disease) ([place]) Unable to make changes today as there are no blood sugar readings.  Informed patient and caregiver of this.  Advised him to return once they have 1-2 weeks of readings.  Continue current dose of Levemir in the interim.  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  Patient does not need wound care.  Advised him to avoid scratching these areas.  Discussed warning signs including pain, purulence, fluctuance, and spreading erythema.  Bedbound state  DME ordered for air overlay mattress given.  Preventative healthcare Flu shot and pneumonia shot deferred today.  Foot exam performed  [facility]. [name], MD 01/02/2017 12:09 PM

## 2017-01-02 NOTE — Assessment & Plan Note (Signed)
Unable to make changes today as there are no blood sugar readings.  Informed patient and caregiver of this.  Advised him to return once they have 1-2 weeks of readings.  Continue current dose of Levemir in the interim.

## 2017-01-02 NOTE — Assessment & Plan Note (Addendum)
Mildly above goal today.  Previously well controlled.  No indication to start medication today.  Continue to watch.

## 2017-01-04 ENCOUNTER — Telehealth: Payer: Self-pay | Admitting: Gastroenterology

## 2017-01-04 NOTE — Telephone Encounter (Signed)
The pt's sister is on the pt's DPR and was advised that the result has not been reviewed.  Janett Billow can you please advise.

## 2017-01-07 ENCOUNTER — Telehealth: Payer: Self-pay | Admitting: Family Medicine

## 2017-01-07 NOTE — Telephone Encounter (Signed)
Patient's sister called to ask if Dr. [name] can fill out paperwork for the patient to get into a long term nursing home. Call the number provided to advise, she is expecting a phone call to discuss.

## 2017-01-07 NOTE — Telephone Encounter (Signed)
Okay to complete a generic FL2?

## 2017-01-07 NOTE — Telephone Encounter (Signed)
That is ok with me.  Algis Greenhouse. [name], MD 01/07/2017 5:16 PM

## 2017-01-08 ENCOUNTER — Emergency Department (HOSPITAL_COMMUNITY): Payer: BLUE CROSS/BLUE SHIELD

## 2017-01-08 ENCOUNTER — Other Ambulatory Visit: Payer: Self-pay

## 2017-01-08 ENCOUNTER — Emergency Department (HOSPITAL_COMMUNITY)
Admission: EM | Admit: 2017-01-08 | Discharge: 2017-01-08 | Disposition: A | Discharge status: Home / Self Care | Payer: BLUE CROSS/BLUE SHIELD | Attending: Emergency Medicine | Admitting: Emergency Medicine

## 2017-01-08 DIAGNOSIS — E119 Type 2 diabetes mellitus without complications: Secondary | ICD-10-CM | POA: Diagnosis not present

## 2017-01-08 DIAGNOSIS — I120 Hypertensive chronic kidney disease with stage 5 chronic kidney disease or end stage renal disease: Secondary | ICD-10-CM | POA: Insufficient documentation

## 2017-01-08 DIAGNOSIS — W19XXXA Unspecified fall, initial encounter: Secondary | ICD-10-CM

## 2017-01-08 DIAGNOSIS — M25551 Pain in right hip: Secondary | ICD-10-CM | POA: Diagnosis not present

## 2017-01-08 DIAGNOSIS — Z79899 Other long term (current) drug therapy: Secondary | ICD-10-CM | POA: Insufficient documentation

## 2017-01-08 DIAGNOSIS — N186 End stage renal disease: Secondary | ICD-10-CM | POA: Insufficient documentation

## 2017-01-08 DIAGNOSIS — Z992 Dependence on renal dialysis: Secondary | ICD-10-CM | POA: Insufficient documentation

## 2017-01-08 DIAGNOSIS — N179 Acute kidney failure, unspecified: Secondary | ICD-10-CM

## 2017-01-08 DIAGNOSIS — Z794 Long term (current) use of insulin: Secondary | ICD-10-CM | POA: Diagnosis not present

## 2017-01-08 MED ORDER — HYDROCODONE-ACETAMINOPHEN 5-325 MG PO TABS
1.0000 | ORAL_TABLET | Freq: Once | ORAL | Status: AC
  Administered 2017-01-08: 1 via ORAL
  Filled 2017-01-08: qty 1

## 2017-01-08 MED ORDER — IBUPROFEN 200 MG PO TABS
600.0000 mg | ORAL_TABLET | Freq: Once | ORAL | Status: AC
  Administered 2017-01-08: 600 mg via ORAL
  Filled 2017-01-08: qty 3

## 2017-01-08 NOTE — ED Triage Notes (Signed)
Pt from home unwittness fall pt found by family member and only c/o right hip pain. Nop shortening or external rotation . VS BP: 130/90 HR: 84 Resp: 18 CBG: 156 Sat 97% RA. Pt is a dialysis pt  That is due today

## 2017-01-08 NOTE — ED Provider Notes (Signed)
[facility] DEPT Provider Note   CSN: [id number] Arrival date & time: 01/08/17  0606     History   Chief Complaint Chief Complaint  Patient presents with  . Fall    right hip pain    HPI Philip West is a 46 y.o. male.  HPI Patient is a 46 year old male who presents the emergency department after slipping and falling today.  He states he was walking to the bathroom and slipped and fell and injured his right hip.  His only complaint is right hip pain.  Denies head injury.  No back pain.  Denies chest and abdominal pain.  Denies weakness of his arms or legs.  He is a dialysis patient is scheduled for dialysis later today.  Pain this time is mild in severity and worse with range of motion and palpation of the right hip.   Past Medical History:  Diagnosis Date  . Allergy   . Diabetes mellitus without complication ([place])   . Hyperlipidemia   . Hypertension   . Kidney failure   . Pancreatic tumor     Patient Active Problem List   Diagnosis Date Noted  . 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 Diarrhea 12/27/2016  . ESRD (end stage renal disease) on dialysis ([place]) 12/11/2016  . Vipoma ([place]) 12/11/2016  . Anoxic brain injury ([place]) 12/11/2016  . Diabetes mellitus with ESRD (end-stage renal disease) ([place])   . Hyperlipidemia associated with type 2 diabetes mellitus ([place])   . Hypertension associated with diabetes [facility])     Past Surgical History:  Procedure Laterality Date  . EYE SURGERY Left        Home Medications    Prior to Admission medications   Medication Sig Start Date End Date Taking? Authorizing Provider  b complex-vitamin c-folic acid (NEPHRO-VITE) 0.8 MG TABS tablet Take 1 tablet by mouth daily. 12/11/16   Vivi Barrack, MD  cholestyramine Lucrezia Starch) 4 g packet Take 1 packet (4 g total) by mouth 2 (two) times daily. 12/11/16   Vivi Barrack, MD  Cyanocobalamin (VITAMIN B 12) 100 MCG LOZG Take 1 by mouth daily 12/11/16   Vivi Barrack, MD  folic acid-pyridoxine-cyancobalamin (FOLTX) 2.5-25-2 MG TABS tablet Take 1 tablet by mouth daily. 12/11/16   Vivi Barrack, MD  insulin detemir (LEVEMIR) 100 UNIT/ML injection Inject 0.1-0.15 mLs (10-15 Units total) into the skin 2 (two) times daily. 15 units at 6 am and 10 units at 6 pm 12/11/16   Vivi Barrack, MD  insulin lispro (HUMALOG) 100 UNIT/ML injection Inject 0-12 Units into the skin 2 (two) times daily. Per sliding scale     [provider]  lipase/protease/amylase (CREON) 12000 units CPEP capsule Take 2 capsules (24,000 Units total) by mouth 3 (three) times daily with meals. 12/11/16   Vivi Barrack, MD  loperamide (IMODIUM A-D) 2 MG tablet Take 1 tablet (2 mg total) by mouth 4 (four) times daily as needed for diarrhea or loose stools. 12/11/16   Vivi Barrack, MD  niacin 500 MG tablet Take 1 tablet (500 mg total) by mouth at bedtime. 12/11/16   Vivi Barrack, MD  octreotide (SANDOSTATIN) 100 MCG/ML SOLN injection Inject 100 mcg into the skin 3 (three) times daily.    [provider]  Pancrelipase, Lip-Prot-Amyl, (CREON) 24000-76000 units CPEP Take 2 capsules 3 times daily ( 6 tablets daily) 12/27/16   Zehr, Laban Emperor, PA-C  pantoprazole (PROTONIX) 40 MG tablet Take 1 tablet (40  mg total) by mouth daily. 12/11/16   Vivi Barrack, MD  thiamine (VITAMIN B-1) 100 MG tablet Take 1 tablet (100 mg total) by mouth daily. 12/11/16   Vivi Barrack, MD    Family History Family History  Problem Relation Age of Onset  . Alcohol abuse Mother   . Depression Mother   . Early death Mother   . Asthma Sister   . Depression Sister   . Depression Daughter   . Arthritis Maternal Grandmother   . Diabetes Maternal Grandmother   . Heart disease Maternal Grandmother   . Hearing loss Maternal Grandmother     Social History Social History   Tobacco Use  . Smoking status: Never Smoker  . Smokeless tobacco: Never Used  Substance Use Topics  . Alcohol  use: No  . Drug use: No     Allergies   Patient has no known allergies.   Review of Systems Review of Systems  All other systems reviewed and are negative.    Physical Exam Updated Vital Signs BP 110/82 (BP Location: Right Arm)   Pulse 87   Temp 98 F (36.7 C) (Oral)   Resp 18   SpO2 97%   Physical Exam  Constitutional: He is oriented to person, place, and time. He appears well-developed and well-nourished.  HENT:  Head: Normocephalic.  Eyes: EOM are normal.  Neck: Normal range of motion.  Pulmonary/Chest: Effort normal.  Abdominal: He exhibits no distension.  Musculoskeletal: Normal range of motion.  No shortening or rotation of the right lower extremity.  Normal pulses right foot.  Compartments of the right lower extremity are soft.  Mild painful range of motion of the right hip without obvious deformity.  Neurological: He is alert and oriented to person, place, and time.  Psychiatric: He has a normal mood and affect.  Nursing note and vitals reviewed.    ED Treatments / Results  Labs (all labs ordered are listed, but only abnormal results are displayed) Labs Reviewed - No data to display  EKG  EKG Interpretation None       Radiology Dg Hip Unilat W Or Wo Pelvis 2-3 Views Right  Result Date: 01/08/2017 CLINICAL DATA:  [place] last night with right hip pain EXAM: DG HIP (WITH OR WITHOUT PELVIS) 2-3V RIGHT COMPARISON:  CT abdomen pelvis of 01/01/2017 FINDINGS: No acute fracture is seen. There is mild degenerative joint disease of both hips, left-greater-than-right. The pelvic rami are intact. The SI joints appear corticated. Considerable arterial calcification is present diffusely. IMPRESSION: 1. No acute fracture. 2. Degenerative change in the hips left-greater-than-right. Diffuse arterial calcifications. Electronically Signed   By: Ivar Drape M.D.   On: 01/08/2017 08:47    Procedures Procedures (including critical care time)  Medications Ordered in  ED Medications  ibuprofen (ADVIL,MOTRIN) tablet 600 mg (600 mg Oral Given 01/08/17 0758)  HYDROcodone-acetaminophen (NORCO/VICODIN) 5-325 MG per tablet 1 tablet (1 tablet Oral Given 01/08/17 0758)     Initial Impression / Assessment and Plan / ED Course  I have reviewed the triage vital signs and the nursing notes.  Pertinent labs & imaging results that were available during my care of the patient were reviewed by me and considered in my medical decision making (see chart for details).     X-rays negative for acute fracture.  Able to bear weight on the right lower extremity.  Discharged home in good condition.  Mechanical fall.  Vital signs stable  Final Clinical Impressions(s) / ED Diagnoses  Final diagnoses:  Fall, initial encounter  Acute right hip pain    ED Discharge Orders    None       Jola Schmidt, MD 01/08/17 [phone]

## 2017-01-08 NOTE — Telephone Encounter (Signed)
Sister is aware form up up front for pick up.

## 2017-01-08 NOTE — Telephone Encounter (Signed)
Spoke with sister and she is aware that we will complete FL2.

## 2017-01-08 NOTE — ED Notes (Signed)
Bed: [facility] Expected date:  Expected time:  Means of arrival:  Comments: 45 rt hip pain fall

## 2017-01-11 ENCOUNTER — Inpatient Hospital Stay (HOSPITAL_COMMUNITY)
Admission: EM | Admit: 2017-01-11 | Discharge: 2017-01-18 | DRG: 673 | Disposition: A | Discharge status: Skilled Nursing Facility | Payer: Medicare Other | Attending: Internal Medicine | Admitting: Internal Medicine

## 2017-01-11 ENCOUNTER — Other Ambulatory Visit: Payer: Self-pay

## 2017-01-11 ENCOUNTER — Encounter (HOSPITAL_COMMUNITY): Payer: Self-pay

## 2017-01-11 DIAGNOSIS — R1310 Dysphagia, unspecified: Secondary | ICD-10-CM | POA: Diagnosis present

## 2017-01-11 DIAGNOSIS — I120 Hypertensive chronic kidney disease with stage 5 chronic kidney disease or end stage renal disease: Secondary | ICD-10-CM | POA: Diagnosis not present

## 2017-01-11 DIAGNOSIS — G931 Anoxic brain damage, not elsewhere classified: Secondary | ICD-10-CM | POA: Diagnosis present

## 2017-01-11 DIAGNOSIS — Z794 Long term (current) use of insulin: Secondary | ICD-10-CM

## 2017-01-11 DIAGNOSIS — Y712 Prosthetic and other implants, materials and accessory cardiovascular devices associated with adverse incidents: Secondary | ICD-10-CM | POA: Diagnosis present

## 2017-01-11 DIAGNOSIS — R0902 Hypoxemia: Secondary | ICD-10-CM | POA: Diagnosis present

## 2017-01-11 DIAGNOSIS — E1122 Type 2 diabetes mellitus with diabetic chronic kidney disease: Secondary | ICD-10-CM | POA: Diagnosis present

## 2017-01-11 DIAGNOSIS — Z79899 Other long term (current) drug therapy: Secondary | ICD-10-CM

## 2017-01-11 DIAGNOSIS — Z419 Encounter for procedure for purposes other than remedying health state, unspecified: Secondary | ICD-10-CM

## 2017-01-11 DIAGNOSIS — R531 Weakness: Secondary | ICD-10-CM

## 2017-01-11 DIAGNOSIS — Z95828 Presence of other vascular implants and grafts: Secondary | ICD-10-CM

## 2017-01-11 DIAGNOSIS — N2581 Secondary hyperparathyroidism of renal origin: Secondary | ICD-10-CM | POA: Diagnosis present

## 2017-01-11 DIAGNOSIS — T8241XA Breakdown (mechanical) of vascular dialysis catheter, initial encounter: Secondary | ICD-10-CM | POA: Diagnosis present

## 2017-01-11 DIAGNOSIS — I4581 Long QT syndrome: Secondary | ICD-10-CM | POA: Diagnosis present

## 2017-01-11 DIAGNOSIS — E871 Hypo-osmolality and hyponatremia: Secondary | ICD-10-CM

## 2017-01-11 DIAGNOSIS — Z992 Dependence on renal dialysis: Secondary | ICD-10-CM

## 2017-01-11 DIAGNOSIS — R296 Repeated falls: Secondary | ICD-10-CM | POA: Diagnosis present

## 2017-01-11 DIAGNOSIS — N186 End stage renal disease: Secondary | ICD-10-CM | POA: Diagnosis present

## 2017-01-11 DIAGNOSIS — D490 Neoplasm of unspecified behavior of digestive system: Secondary | ICD-10-CM | POA: Diagnosis present

## 2017-01-11 HISTORY — DX: Malignant neoplasm of endocrine pancreas: C25.4

## 2017-01-11 HISTORY — DX: Other malignant neuroendocrine tumors: C7A.8

## 2017-01-11 HISTORY — DX: Prurigo nodularis: L28.1

## 2017-01-11 HISTORY — DX: Anoxic brain damage, not elsewhere classified: G93.1

## 2017-01-11 LAB — COMPREHENSIVE METABOLIC PANEL
ALBUMIN: 2.9 g/dL — AB (ref 3.5–5.0)
ALT: 44 U/L (ref 17–63)
AST: 41 U/L (ref 15–41)
Alkaline Phosphatase: 568 U/L — ABNORMAL HIGH (ref 38–126)
Anion gap: 15 (ref 5–15)
BILIRUBIN TOTAL: 1.2 mg/dL (ref 0.3–1.2)
BUN: 19 mg/dL (ref 6–20)
CHLORIDE: 100 mmol/L — AB (ref 101–111)
CO2: 22 mmol/L (ref 22–32)
Calcium: 7.7 mg/dL — ABNORMAL LOW (ref 8.9–10.3)
Creatinine, Ser: 3.88 mg/dL — ABNORMAL HIGH (ref 0.61–1.24)
GFR calc Af Amer: 20 mL/min — ABNORMAL LOW (ref >60)
GFR, EST NON AFRICAN AMERICAN: 17 mL/min — AB (ref >60)
GLUCOSE: 106 mg/dL — AB (ref 65–99)
POTASSIUM: 3.4 mmol/L — AB (ref 3.5–5.1)
Sodium: 137 mmol/L (ref 135–145)
TOTAL PROTEIN: 6.9 g/dL (ref 6.5–8.1)

## 2017-01-11 LAB — CBC
HEMATOCRIT: 36.7 % — AB (ref 39.0–52.0)
HEMOGLOBIN: 11.9 g/dL — AB (ref 13.0–17.0)
MCH: 26.9 pg (ref 26.0–34.0)
MCHC: 32.4 g/dL (ref 30.0–36.0)
MCV: 83 fL (ref 78.0–100.0)
Platelets: 261 10*3/uL (ref 150–400)
RBC: 4.42 MIL/uL (ref 4.22–5.81)
RDW: 17.6 % — AB (ref 11.5–15.5)
WBC: 8 10*3/uL (ref 4.0–10.5)

## 2017-01-11 LAB — LIPASE, BLOOD: LIPASE: 18 U/L (ref 11–51)

## 2017-01-11 NOTE — ED Triage Notes (Addendum)
Per Family, PT is coming from home with request to help patient be placed in a nursing facility. Family reports that he is incontinent of bowel, needs regular shots, doesn't walk much, and he has been vomiting starting yesterday. Reports the only acute symptom is the vomiting.   Also, family reports pt has a boil under his arm and had a fall a couple days ago where he is reported to have right hip pain. Explains "I just got dizzy and I fell." Pt is alert and oriented x4. Unable to tell specific month, but states year correctly.   Hx of Dialysis and End Stage Kidney Failure

## 2017-01-12 ENCOUNTER — Encounter (HOSPITAL_COMMUNITY): Payer: Self-pay | Admitting: *Deleted

## 2017-01-12 ENCOUNTER — Emergency Department (HOSPITAL_COMMUNITY): Payer: Medicare Other

## 2017-01-12 DIAGNOSIS — Z95828 Presence of other vascular implants and grafts: Secondary | ICD-10-CM | POA: Diagnosis not present

## 2017-01-12 DIAGNOSIS — K529 Noninfective gastroenteritis and colitis, unspecified: Secondary | ICD-10-CM

## 2017-01-12 DIAGNOSIS — G931 Anoxic brain damage, not elsewhere classified: Secondary | ICD-10-CM | POA: Diagnosis not present

## 2017-01-12 DIAGNOSIS — R0902 Hypoxemia: Secondary | ICD-10-CM | POA: Diagnosis not present

## 2017-01-12 DIAGNOSIS — R296 Repeated falls: Secondary | ICD-10-CM | POA: Diagnosis not present

## 2017-01-12 DIAGNOSIS — N186 End stage renal disease: Secondary | ICD-10-CM | POA: Diagnosis not present

## 2017-01-12 DIAGNOSIS — Z992 Dependence on renal dialysis: Secondary | ICD-10-CM | POA: Diagnosis not present

## 2017-01-12 DIAGNOSIS — I10 Essential (primary) hypertension: Secondary | ICD-10-CM

## 2017-01-12 DIAGNOSIS — L989 Disorder of the skin and subcutaneous tissue, unspecified: Secondary | ICD-10-CM | POA: Diagnosis not present

## 2017-01-12 LAB — INFLUENZA PANEL BY PCR (TYPE A & B)
Influenza A By PCR: NEGATIVE
Influenza B By PCR: NEGATIVE

## 2017-01-12 LAB — CBG MONITORING, ED
GLUCOSE-CAPILLARY: 166 mg/dL — AB (ref 65–99)
GLUCOSE-CAPILLARY: 151 mg/dL — AB (ref 65–99)
GLUCOSE-CAPILLARY: 155 mg/dL — AB (ref 65–99)
GLUCOSE-CAPILLARY: 122 mg/dL — AB (ref 65–99)
GLUCOSE-CAPILLARY: 107 mg/dL — AB (ref 65–99)
GLUCOSE-CAPILLARY: 116 mg/dL — AB (ref 65–99)

## 2017-01-12 MED ORDER — INSULIN ASPART 100 UNIT/ML ~~LOC~~ SOLN
0.0000 [IU] | Freq: Three times a day (TID) | SUBCUTANEOUS | Status: DC
  Administered 2017-01-17: 1 [IU] via SUBCUTANEOUS

## 2017-01-12 MED ORDER — PANCRELIPASE (LIP-PROT-AMYL) 12000-38000 UNITS PO CPEP
24000.0000 [IU] | ORAL_CAPSULE | Freq: Three times a day (TID) | ORAL | Status: DC
  Administered 2017-01-12 – 2017-01-18 (×14): 24000 [IU] via ORAL
  Filled 2017-01-12 (×19): qty 2

## 2017-01-12 MED ORDER — INSULIN DETEMIR 100 UNIT/ML ~~LOC~~ SOLN
10.0000 [IU] | Freq: Every evening | SUBCUTANEOUS | Status: DC
  Administered 2017-01-13 – 2017-01-14 (×2): 10 [IU] via SUBCUTANEOUS
  Filled 2017-01-12 (×5): qty 0.1

## 2017-01-12 MED ORDER — LOPERAMIDE HCL 2 MG PO CAPS
2.0000 mg | ORAL_CAPSULE | Freq: Four times a day (QID) | ORAL | Status: DC | PRN
  Administered 2017-01-14 – 2017-01-17 (×5): 2 mg via ORAL
  Filled 2017-01-12 (×5): qty 1

## 2017-01-12 MED ORDER — NIACIN 500 MG PO TABS
500.0000 mg | ORAL_TABLET | Freq: Every day | ORAL | Status: DC
  Administered 2017-01-12 – 2017-01-17 (×6): 500 mg via ORAL
  Filled 2017-01-12 (×8): qty 1

## 2017-01-12 MED ORDER — PANTOPRAZOLE SODIUM 40 MG PO TBEC
40.0000 mg | DELAYED_RELEASE_TABLET | Freq: Every day | ORAL | Status: DC
  Administered 2017-01-13 – 2017-01-14 (×2): 40 mg via ORAL
  Filled 2017-01-12 (×2): qty 1

## 2017-01-12 MED ORDER — INSULIN DETEMIR 100 UNIT/ML ~~LOC~~ SOLN
15.0000 [IU] | Freq: Every day | SUBCUTANEOUS | Status: DC
  Administered 2017-01-14: 15 [IU] via SUBCUTANEOUS
  Filled 2017-01-12 (×4): qty 0.15

## 2017-01-12 MED ORDER — FOLIC ACID-VIT B6-VIT B12 2.5-25-1 MG PO TABS
1.0000 | ORAL_TABLET | Freq: Every day | ORAL | Status: DC
  Administered 2017-01-12 – 2017-01-14 (×2): 1 via ORAL
  Filled 2017-01-12 (×3): qty 1

## 2017-01-12 MED ORDER — CHOLESTYRAMINE 4 G PO PACK
4.0000 g | PACK | Freq: Two times a day (BID) | ORAL | Status: DC
  Administered 2017-01-12 – 2017-01-15 (×4): 4 g via ORAL
  Filled 2017-01-12 (×10): qty 1

## 2017-01-12 MED ORDER — INSULIN DETEMIR 100 UNIT/ML ~~LOC~~ SOLN: 10.0000 [IU] | Freq: Two times a day (BID) | SUBCUTANEOUS | Status: DC

## 2017-01-12 MED ORDER — ACETAMINOPHEN 325 MG PO TABS
650.0000 mg | ORAL_TABLET | Freq: Once | ORAL | Status: AC
  Administered 2017-01-12: 650 mg via ORAL
  Filled 2017-01-12: qty 2

## 2017-01-12 NOTE — ED Provider Notes (Signed)
I was called by the nurse because pt has pulled out his tunneled dialysis catheter.  It is unclear when this happened as pt said he did not pull it out.  The pt does not have any bleeding at the site.  The pt was d/w Dr. Scot Dock (vascular) who will see him and replace the catheter tomorrow morning so he can get dialysis on Monday.   Isla Pence, MD 01/12/17 [phone]

## 2017-01-12 NOTE — ED Notes (Signed)
Pt O2 sats dropping to 74% when he is sleeping, with a good wave form pleth on the monitor. Pt placed on 2 L O2 via nasal cannula and sats improved to 96%

## 2017-01-12 NOTE — ED Provider Notes (Addendum)
Pt seen last night. Likely needs placement. Daughter left. Says cannot take care of at home. Several ED visits in past 2 months for similar. W/u fairly unremarkable in light of known renal disease. Subsequently developed fever. From GI illness? Abdominal exam benign. Flu negative. CXR w/o focal opacity. Essentially anuric.   Trying to place from ED. Home meds ordered. Cannot tell me when last had HD. No emergent need right now but will likely require it at some point if stays in ED beyond today. Renal panel ordered for tomorrow am.    Virgel Manifold, MD 01/12/17 1327  Notified by social work that pt actually gets $3,800/month from disability. Apparently financial/medical POA is in [place]. It's ridiculous for [place] to continue to keep him in the ED trying to place him from here if that's the case. He continues to have diarrhea. Will check cdiff but he has a hx of VIPOMA and diarrhea may be chronic. This is likely going to be a revolving door until he is placed. He will likely return if discharged.   4:38 PM Discussed with Dr Sherral Hammers, hospitalist service. I appreciate him reviewing the case and evaluating the patient. He does not feel there is medical necessity for admission. Back to social work to try to come to solution with family.     Virgel Manifold, MD 01/12/17 [phone]

## 2017-01-12 NOTE — ED Notes (Signed)
Dr Gilford Raid assessed pt - d/t aware pt removed his dialysis catheter from right chest - min dried blood noted on gown. Area to chest noted to be dry - no redness/swelling.

## 2017-01-12 NOTE — H&P (Signed)
[facility] History and Physical  ALEXIOS KEOWN WCH:[id number] DOB: 1970/08/29 DOA: 01/11/2017  Referring physician:  PCP: Vivi Barrack, MD   Chief Complaint:   HPI: Philip West is a 46 y.o. male  moved here from [place] several months ago  PMHx ESRD on HD M,/W/F, frequent falls DM Type 2, Essential HTN, Pancreatic Tumor. Anoxic brain injury (found down by [facility] in [place] in February),  Per patient has had diarrhea for 10 years (chronic). Negative CP, negative SOB, negative abdominal pain, negative N/V. States does not use home O2. Patient stated approximate 2 weeks ago had rash on his torso arms pustular in nature, extremely itchy.    Review of Systems:  Constitutional:  No weight loss, night sweats, Fevers, chills, fatigue.  HEENT:  No headaches, Difficulty swallowing,Tooth/dental problems,Sore throat,  No sneezing, itching, ear ache, nasal congestion, post nasal drip,  Cardio-vascular:  No chest pain, Orthopnea, PND, swelling in lower extremities, anasarca, dizziness, palpitations  GI:  No heartburn, indigestion, abdominal pain, nausea, vomiting, diarrhea, change in bowel habits, loss of appetite  Resp:  No shortness of breath with exertion or at rest. No excess mucus, no productive cough, No non-productive cough, No coughing up of blood.No change in color of mucus.No wheezing.No chest wall deformity  Skin:  no rash or lesions.  GU:  no dysuria, change in color of urine, no urgency or frequency. No flank pain.  Musculoskeletal:  No joint pain or swelling. No decreased range of motion. No back pain.  Psych:  No change in mood or affect. No depression or anxiety. No memory loss.   Past Medical History:  Diagnosis Date  . Allergy   . Anoxic brain injury ([place])   . Diabetes mellitus without complication ([place])   . Hyperlipidemia   . Hypertension   . Kidney failure   . Pancreatic tumor   . Picker's nodule   . [facility])    Past Surgical  History:  Procedure Laterality Date  . EYE SURGERY Left    Social History:  reports that  has never smoked. he has never used smokeless tobacco. He reports that he does not drink alcohol or use drugs.  No Known Allergies  Family History  Problem Relation Age of Onset  . Alcohol abuse Mother   . Depression Mother   . Early death Mother   . Asthma Sister   . Depression Sister   . Depression Daughter   . Arthritis Maternal Grandmother   . Diabetes Maternal Grandmother   . Heart disease Maternal Grandmother   . Hearing loss Maternal Grandmother      Prior to Admission medications   Medication Sig Start Date End Date Taking? Authorizing Provider  b complex-vitamin c-folic acid (NEPHRO-VITE) 0.8 MG TABS tablet Take 1 tablet by mouth daily. 12/11/16  Yes Vivi Barrack, MD  cholestyramine Lucrezia Starch) 4 g packet Take 1 packet (4 g total) by mouth 2 (two) times daily. 12/11/16  Yes Vivi Barrack, MD  Cyanocobalamin (VITAMIN B 12) 100 MCG LOZG Take 1 by mouth daily 12/11/16  Yes Vivi Barrack, MD  folic acid-pyridoxine-cyancobalamin (FOLTX) 2.5-25-2 MG TABS tablet Take 1 tablet by mouth daily. 12/11/16  Yes Vivi Barrack, MD  insulin detemir (LEVEMIR) 100 UNIT/ML injection Inject 0.1-0.15 mLs (10-15 Units total) into the skin 2 (two) times daily. 15 units at 6 am and 10 units at 6 pm 12/11/16  Yes Vivi Barrack, MD  lipase/protease/amylase (CREON) 12000 units CPEP capsule Take 2  capsules (24,000 Units total) by mouth 3 (three) times daily with meals. 12/11/16  Yes Vivi Barrack, MD  loperamide (IMODIUM A-D) 2 MG tablet Take 1 tablet (2 mg total) by mouth 4 (four) times daily as needed for diarrhea or loose stools. 12/11/16  Yes Vivi Barrack, MD  niacin 500 MG tablet Take 1 tablet (500 mg total) by mouth at bedtime. 12/11/16  Yes Vivi Barrack, MD  pantoprazole (PROTONIX) 40 MG tablet Take 1 tablet (40 mg total) by mouth daily. 12/11/16  Yes Vivi Barrack, MD  thiamine (VITAMIN  B-1) 100 MG tablet Take 1 tablet (100 mg total) by mouth daily. 12/11/16  Yes Vivi Barrack, MD  insulin lispro (HUMALOG) 100 UNIT/ML injection Inject 0-12 Units into the skin 2 (two) times daily. Per sliding scale     [provider]  Pancrelipase, Lip-Prot-Amyl, (CREON) 24000-76000 units CPEP Take 2 capsules 3 times daily ( 6 tablets daily) Patient not taking: Reported on 01/11/2017 12/27/16   Zehr, Laban Emperor, PA-C     Consultants:    Procedures/Significant Events:    I have personally reviewed and interpreted all radiology studies and my findings are as above.   VENTILATOR SETTINGS:    Cultures None  Antimicrobials: Anti-infectives (From admission, onward)   None       Devices *   LINES / TUBES:      Continuous Infusions:  Physical Exam: Vitals:   01/12/17 1030 01/12/17 1034 01/12/17 1234 01/12/17 1426  BP: (!) 121/92  111/76   Pulse: 97 96 94   Resp: 14  16   Temp: 99.6 F (37.6 C)  99.1 F (37.3 C)   TempSrc: Oral  Oral   SpO2: 94% (!) 80% 95% 93%  Weight:      Height:        Wt Readings from Last 3 Encounters:  01/11/17 191 lb (86.6 kg)  12/27/16 191 lb (86.6 kg)  11/27/16 191 lb (86.6 kg)    General: A/O 2 (does not know where, when), does know he is in the hospital, No acute respiratory distress, cachectic Eyes: negative scleral hemorrhage, negative anisocoria, negative icterus ENT: Negative Runny nose, negative gingival bleeding, Neck:  Negative scars, masses, torticollis, lymphadenopathy, JVD Lungs: Clear to auscultation bilaterally without wheezes or crackles Cardiovascular: Regular rate and rhythm without murmur gallop or rub normal S1 and S2 Abdomen: negative abdominal pain, nondistended, positive soft, bowel sounds, no rebound, no ascites, no appreciable mass Extremities: No significant cyanosis, clubbing, or edema bilateral lower extremities Skin: Healing multiple lesions on torso consistent with possible healing  chickenpox. Psychiatric:  Negative depression, negative anxiety, negative fatigue, negative mania  Central nervous system:  Cranial nerves II through XII intact, tongue/uvula midline, all extremities muscle strength 5/5, sensation intact throughout,  negative dysarthria, negative expressive aphasia, negative receptive aphasia.        Labs on Admission:  Basic Metabolic Panel: Recent Labs  Lab 01/11/17 1830  NA 137  K 3.4*  CL 100*  CO2 22  GLUCOSE 106*  BUN 19  CREATININE 3.88*  CALCIUM 7.7*   Liver Function Tests: Recent Labs  Lab 01/11/17 1830  AST 41  ALT 44  ALKPHOS 568*  BILITOT 1.2  PROT 6.9  ALBUMIN 2.9*   Recent Labs  Lab 01/11/17 1830  LIPASE 18   No results for input(s): AMMONIA in the last 168 hours. CBC: Recent Labs  Lab 01/11/17 1830  WBC 8.0  HGB 11.9*  HCT 36.7*  MCV 83.0  PLT 261   Cardiac Enzymes: No results for input(s): CKTOTAL, CKMB, CKMBINDEX, TROPONINI in the last 168 hours.  BNP (last 3 results) No results for input(s): BNP in the last 8760 hours.  ProBNP (last 3 results) No results for input(s): PROBNP in the last 8760 hours.  CBG: Recent Labs  Lab 01/12/17 0644 01/12/17 0756 01/12/17 0808 01/12/17 1234  GLUCAP 166* 151* 155* 122*    Radiological Exams on Admission: Ct Head Wo Contrast  Result Date: 01/12/2017 CLINICAL DATA:  Altered level of consciousness. Dizziness leading to fall. EXAM: CT HEAD WITHOUT CONTRAST TECHNIQUE: Contiguous axial images were obtained from the base of the skull through the vertex without intravenous contrast. COMPARISON:  None. FINDINGS: Brain: Mild generalized atrophy for age. No intracranial hemorrhage, mass effect, or midline shift. No hydrocephalus. The basilar cisterns are patent. No evidence of territorial infarct or acute ischemia. No extra-axial or intracranial fluid collection. Vascular: Atherosclerosis of skullbase vasculature without hyperdense vessel or abnormal calcification. Skull:  No fracture or focal lesion. Sinuses/Orbits: Paranasal sinuses and mastoid air cells are clear. The visualized orbits are unremarkable. Probable cataract resection on the left. Other: Subcutaneous calcifications suggest diabetes. IMPRESSION: No acute intracranial abnormality. Electronically Signed   By: Jeb Levering M.D.   On: 01/12/2017 00:34   Dg Abd Acute W/chest  Result Date: 01/12/2017 CLINICAL DATA:  Vomiting. EXAM: DG ABDOMEN ACUTE W/ 1V CHEST COMPARISON:  CT 01/01/2017, radiograph 11/27/2016 FINDINGS: Right-sided dialysis catheter remains in place. Elevated right hemidiaphragm with right pleural effusion. There is cardiomegaly and vascular congestion. Increased air throughout small and large bowel in a nonobstructive pattern appears similar to prior exam. No free air. Small colonic stool burden. Extensive vascular calcifications. The bones are under mineralized. IMPRESSION: 1. Cardiomegaly with right pleural effusion and vascular congestion. 2. No bowel obstruction or free air. Increased air throughout nondilated bowel is similar to prior exams, enteritis could have this appearance. Electronically Signed   By: Jeb Levering M.D.   On: 01/12/2017 00:48    EKG: Independently reviewed.     Assessment/Plan Active Problems:   * No active hospital problems. *  Anoxic brain injury -Patient appears at baseline although clearly has gaps in his ability to reason and memory is extremely high functioning answers questions appropriately. No reason for admission  Frequent falls  Essential HTN -Controlled without medication  Hypoxia -Although patient states not on home O2 when he falls asleep becomes hypoxic. -Continue titrate O2 to maintain SPO2> 93%  Fever -Patient spikes a 1 time fever, negative leukocytosis, negative left shift, negative bands. Currently no sign of acute infection.  ESRD on HD M,/W/F, -Currently patient's metabolic panel do not indicate requirement for  dialysis. -If patient still in ED on Monday will need to contact Nephrology and convince them to perform HD  Chronic Diarrhea -Baseline per patient  Goals of care -Extremely difficult situation patient has multiple medical problems unfortunately they're all chronic although patient did spike a one-time fever negative leukocytosis, negative bands negative left shift. Currently unable to come up with diagnosis requiring admission to hospital. Discussed case extensively with Dr. Virgel Manifold, and LCSW Kingsley Spittle. In this situation if we admitted patient it would be for overnight as he does not have retrieved diagnosis for admission. Patient would wind up being discharged in the A.m. this would not help with placement of patient to SNF as he would require an inpatient admission and stay of at least 2 days. -If patient's health status changes  and meets criteria for admission please reconsult [place] for admission.       Code Status: Unknown  (DVT Prophylaxis: Per ED Family Communication: None  Disposition Plan: TBD   Data Reviewed: Care during the described time interval was provided by me .  I have reviewed this patient's available data, including medical history, events of note, physical examination, and all test results as part of my evaluation.   Time spent: 60 min  [place], [facility] Pager [phone]

## 2017-01-12 NOTE — ED Notes (Addendum)
Dinner tray ordered.

## 2017-01-12 NOTE — Progress Notes (Signed)
CSW spoke with EDP to determine plan for discharge. CSW explained to EDP process of faxing out and insurance authorization. CSW and EDP discussed case, social barriers, and past history with client. Patient under the care of his sister at home, who has stated that she can no longer care for him there and desires placement.   EDP to keep patient until placement is possible.  Madilyn Fireman, MSW, LCSW-A Weekend Clinical Social Worker [phone]

## 2017-01-12 NOTE — ED Notes (Signed)
Dinner tray at bedside

## 2017-01-12 NOTE — ED Notes (Signed)
Pt states he no longer makes urine 

## 2017-01-12 NOTE — Progress Notes (Addendum)
10AM: CSW spoke with patients sister, Barbaraann Share [phone], at length regarding current stay at ED and disposition. Sister states she is no longer able to care for patient due to needing additional care at home and patient being a fall risk. Sister currently lives in an apartment on the 3rd floor and has a difficult time getting patient to HD appointments( please read note from 10/10 regarding current living situation). Patient was receiving [facility] services however per sister- have stopping coming to house. Sister states she is currently in school and works at [employer] however this has been put on hold to help take care of patient. Sister states "I can not fail my classes because of him". Per sister, patient recently received Medicare Part A and B to help with placement. CSW attempted to explain Medicare guidelines (3 night qualifying stay). Sister informed CSW of patient having POA, Wyonia Hough [phone], who is a family friend. Per sister, POA is financial and medically POA- patient receives "about $4000 a month from disability". Sister reiterated " I just cant do it anymore. I have to take care of myself".    1PM: CSW spoke with patients POA, Wyonia Hough [phone], to discuss more extensively patients finances. Ms. Owens Shark informed CSW that patient is currently receiving long-term disability from his current employer since February of this year. Per Ms. Owens Shark, patient has many bills still in [place] including: [employer], IRS, apartment fees, and now [employer]. Per Ms. Owens Shark, patient recently received Medicare A and B to assist with placement however was unaware patient would need 3 night stay. Ms. Owens Shark does confirm that patient receives $3800 with his disability check however does not see much of it after bills. CSW questioned what her/ patients long-term plans were- Ms. Owens Shark stated to return to [place].   CSW explained to Ms. Owens Shark barriers to placement at this time including:  insurance potentially not covering cost and need for long-term care ([employer]). Ms. Owens Shark voiced her frustration with being told different information in [place]- however understood.   3:30PM: CSW spoke with patients sister, Barbaraann Share [phone], to provide update on discharge plan. CSW informed sister of barriers to placement from ED including insurance barriers and payment for long-term care. Sister stated "he can not live with me anymore. If you've have contact with his POA then she can come pick him up but as far as me- im not doing it anymore".   CSW attempted to contact POA at that time but was unable to- voicemail left for return call.   PLAN: At this time, CSW doubts patients insurance(BCBS) will pay for SNF placement from ED. CSW will continue to reach out to patients family and POA for support/ picking up patient. CSW will reach out to leadership for assistance with difficult disposition.   Kingsley Spittle, [facility] Emergency Room Clinical Social Worker [phone]

## 2017-01-12 NOTE — H&P (View-Only) (Signed)
   VASCULAR SURGERY ASSESSMENT & PLAN:   This patient had a right sided tunneled dialysis catheter and reportedly pulled this out while in the emergency department.  I have been asked to replace this.  I have added him onto the schedule tomorrow and if written orders to make him n.p.o.  The patient has an anoxic brain injury and we may not be able to obtain consent.  It would have to be done urgently given that he will need dialysis.   SUBJECTIVE:   Unable to assess as he has an anoxic brain injury.  PHYSICAL EXAM:   Vitals:   01/12/17 1034 01/12/17 1234 01/12/17 1426 01/12/17 1618  BP:  111/76  125/81  Pulse: 96 94  (!) 117  Resp:  16  16  Temp:  99.1 F (37.3 C)  97.6 F (36.4 C)  TempSrc:  Oral  Oral  SpO2: (!) 80% 95% 93% 100%  Weight:      Height:       Currently no bleeding from where catheter came out.  LABS:   Lab Results  Component Value Date   WBC 8.0 01/11/2017   HGB 11.9 (L) 01/11/2017   HCT 36.7 (L) 01/11/2017   MCV 83.0 01/11/2017   PLT 261 01/11/2017   Lab Results  Component Value Date   CREATININE 3.88 (H) 01/11/2017   Lab Results  Component Value Date   INR 1.40 11/27/2016   CBG (last 3)  Recent Labs    01/12/17 0808 01/12/17 1234 01/12/17 1819  GLUCAP 155* 122* 107*    PROBLEM LIST:    Active Problems:   * No active hospital problems. *   CURRENT MEDS:   . cholestyramine  4 g Oral BID PC  . Folic Acid-Vit Z6-XWR U04  1 tablet Oral Daily  . insulin aspart  0-9 Units Subcutaneous TID WC  . insulin detemir  15 Units Subcutaneous Q0600   And  . insulin detemir  10 Units Subcutaneous QPM  . lipase/protease/amylase  24,000 Units Oral TID WC  . niacin  500 mg Oral QHS  . pantoprazole  40 mg Oral Daily    Deitra Mayo Beeper: [phone] Office: [phone] 01/12/2017

## 2017-01-12 NOTE — ED Notes (Signed)
Waiting for medication to be sent by pharmacy. Message has been sent twice to dispense medication.

## 2017-01-12 NOTE — Progress Notes (Signed)
   VASCULAR SURGERY ASSESSMENT & PLAN:   This patient had a right sided tunneled dialysis catheter and reportedly pulled this out while in the emergency department.  I have been asked to replace this.  I have added him onto the schedule tomorrow and if written orders to make him n.p.o.  The patient has an anoxic brain injury and we may not be able to obtain consent.  It would have to be done urgently given that he will need dialysis.   SUBJECTIVE:   Unable to assess as he has an anoxic brain injury.  PHYSICAL EXAM:   Vitals:   01/12/17 1034 01/12/17 1234 01/12/17 1426 01/12/17 1618  BP:  111/76  125/81  Pulse: 96 94  (!) 117  Resp:  16  16  Temp:  99.1 F (37.3 C)  97.6 F (36.4 C)  TempSrc:  Oral  Oral  SpO2: (!) 80% 95% 93% 100%  Weight:      Height:       Currently no bleeding from where catheter came out.  LABS:   Lab Results  Component Value Date   WBC 8.0 01/11/2017   HGB 11.9 (L) 01/11/2017   HCT 36.7 (L) 01/11/2017   MCV 83.0 01/11/2017   PLT 261 01/11/2017   Lab Results  Component Value Date   CREATININE 3.88 (H) 01/11/2017   Lab Results  Component Value Date   INR 1.40 11/27/2016   CBG (last 3)  Recent Labs    01/12/17 0808 01/12/17 1234 01/12/17 1819  GLUCAP 155* 122* 107*    PROBLEM LIST:    Active Problems:   * No active hospital problems. *   CURRENT MEDS:   . cholestyramine  4 g Oral BID PC  . Folic Acid-Vit Q9-IHW T88  1 tablet Oral Daily  . insulin aspart  0-9 Units Subcutaneous TID WC  . insulin detemir  15 Units Subcutaneous Q0600   And  . insulin detemir  10 Units Subcutaneous QPM  . lipase/protease/amylase  24,000 Units Oral TID WC  . niacin  500 mg Oral QHS  . pantoprazole  40 mg Oral Daily    Deitra Mayo Beeper: [phone] Office: [phone] 01/12/2017

## 2017-01-12 NOTE — ED Notes (Signed)
EDP made aware of pt O2 sats

## 2017-01-12 NOTE — ED Notes (Signed)
Vascular Surgeon spoke w/pt briefly. Aware pt's POA is in [place] and pt has an anoxic brain injury.

## 2017-01-12 NOTE — ED Notes (Signed)
Pt given sandwich meal and water per request.

## 2017-01-12 NOTE — ED Notes (Signed)
Cleaned patient up patient pt had use the bathroom on himself patient is resting with call bell in reach

## 2017-01-12 NOTE — ED Notes (Signed)
Sats continue to drop to mid 80's when sleeping for few seconds then returns to 92-95%.

## 2017-01-12 NOTE — ED Notes (Signed)
Dr Sherral Hammers advised unable to admit pt - Philip West, Charge RN, and [place], [place], aware.

## 2017-01-12 NOTE — ED Notes (Signed)
Pt attempted to refuse for swab to be performed for flu panel. Stated "I don't want that". RN encouraged pt to cooperate - swab obtained. Pt noted to be lying on stretcher. O2 infusing at 2L/min - sats 95%. Pt states continues w/nausea - requesting orange juice. Advised unable to provide at this time d/t nausea and may have Diet Ginger Ale - states OK.

## 2017-01-12 NOTE — ED Notes (Addendum)
Breakfast tray ordered. Pt. Did not want snack, pt. Wanted some ginger ale, assisted pt to drink ginger ale

## 2017-01-12 NOTE — ED Provider Notes (Signed)
[facility] Provider Note   CSN: [id number] Arrival date & time: 01/11/17  1821     History   Chief Complaint Chief Complaint  Patient presents with  . Emesis    HPI Philip West is a 46 y.o. male.  Patient brought to the emergency department for evaluation of declining status.  Patient currently living with his sister.  He moved here from [place] several months ago.  He is a dialysis patient, thought he might have a better chance of getting a kidney transplant here than he was in [place].  Sister reports that he has progressively declined and she can no longer take care of him.  He has had multiple falls, cannot ambulate or get around on his own.  She has been trying to work with the case worker at dialysis, but they have told her multiple times that she just needed to bring him to the emergency department to solve her problems.  Sister reports that in addition to the frequent falls that he has been experiencing, he has become incontinent of his stool, over the last day or so has been experiencing vomiting and diarrhea.  Denies any abdominal pain.  He does not feel nauseated currently.  He has not had any chest pain, shortness of breath.      Past Medical History:  Diagnosis Date  . Allergy   . Diabetes mellitus without complication ([place])   . Hyperlipidemia   . Hypertension   . Kidney failure   . Pancreatic tumor     Patient Active Problem List   Diagnosis Date Noted  . 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 Diarrhea 12/27/2016  . ESRD (end stage renal disease) on dialysis ([place]) 12/11/2016  . 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 Anoxic brain injury ([place]) 12/11/2016  . Diabetes mellitus with ESRD (end-stage renal disease) ([place])   . Hyperlipidemia associated with type 2 diabetes mellitus ([place])   . Hypertension associated with diabetes [facility])     Past Surgical History:  Procedure Laterality Date  . EYE SURGERY Left        Home  Medications    Prior to Admission medications   Medication Sig Start Date End Date Taking? Authorizing Provider  b complex-vitamin c-folic acid (NEPHRO-VITE) 0.8 MG TABS tablet Take 1 tablet by mouth daily. 12/11/16  Yes Vivi Barrack, MD  cholestyramine Lucrezia Starch) 4 g packet Take 1 packet (4 g total) by mouth 2 (two) times daily. 12/11/16  Yes Vivi Barrack, MD  Cyanocobalamin (VITAMIN B 12) 100 MCG LOZG Take 1 by mouth daily 12/11/16  Yes Vivi Barrack, MD  folic acid-pyridoxine-cyancobalamin (FOLTX) 2.5-25-2 MG TABS tablet Take 1 tablet by mouth daily. 12/11/16  Yes Vivi Barrack, MD  insulin detemir (LEVEMIR) 100 UNIT/ML injection Inject 0.1-0.15 mLs (10-15 Units total) into the skin 2 (two) times daily. 15 units at 6 am and 10 units at 6 pm 12/11/16  Yes Vivi Barrack, MD  lipase/protease/amylase (CREON) 12000 units CPEP capsule Take 2 capsules (24,000 Units total) by mouth 3 (three) times daily with meals. 12/11/16  Yes Vivi Barrack, MD  loperamide (IMODIUM A-D) 2 MG tablet Take 1 tablet (2 mg total) by mouth 4 (four) times daily as needed for diarrhea or loose stools. 12/11/16  Yes Vivi Barrack, MD  niacin 500 MG tablet Take 1 tablet (500 mg total) by mouth at bedtime. 12/11/16  Yes Vivi Barrack, MD  pantoprazole (PROTONIX) 40 MG tablet Take 1  tablet (40 mg total) by mouth daily. 12/11/16  Yes Vivi Barrack, MD  thiamine (VITAMIN B-1) 100 MG tablet Take 1 tablet (100 mg total) by mouth daily. 12/11/16  Yes Vivi Barrack, MD  insulin lispro (HUMALOG) 100 UNIT/ML injection Inject 0-12 Units into the skin 2 (two) times daily. Per sliding scale     [provider]  Pancrelipase, Lip-Prot-Amyl, (CREON) 24000-76000 units CPEP Take 2 capsules 3 times daily ( 6 tablets daily) Patient not taking: Reported on 01/11/2017 12/27/16   Zehr, Laban Emperor, PA-C    Family History Family History  Problem Relation Age of Onset  . Alcohol abuse Mother   . Depression Mother   .  Early death Mother   . Asthma Sister   . Depression Sister   . Depression Daughter   . Arthritis Maternal Grandmother   . Diabetes Maternal Grandmother   . Heart disease Maternal Grandmother   . Hearing loss Maternal Grandmother     Social History Social History   Tobacco Use  . Smoking status: Never Smoker  . Smokeless tobacco: Never Used  Substance Use Topics  . Alcohol use: No  . Drug use: No     Allergies   Patient has no known allergies.   Review of Systems Review of Systems  Constitutional: Positive for fatigue.  Gastrointestinal: Positive for diarrhea, nausea and vomiting.  All other systems reviewed and are negative.    Physical Exam Updated Vital Signs BP (!) 128/97 (BP Location: Right Arm)   Pulse 91   Temp 98 F (36.7 C) (Oral)   Resp 14   Ht 6' (1.829 m)   Wt 86.6 kg (191 lb)   SpO2 97%   BMI 25.90 kg/m   Physical Exam  Constitutional: He is oriented to person, place, and time. No distress.  Very thin  HENT:  Head: Normocephalic and atraumatic.  Right Ear: Hearing normal.  Left Ear: Hearing normal.  Nose: Nose normal.  Mouth/Throat: Oropharynx is clear and moist and mucous membranes are normal.  Eyes: Conjunctivae and EOM are normal. Pupils are equal, round, and reactive to light.  Neck: Normal range of motion. Neck supple.  Cardiovascular: Regular rhythm, S1 normal and S2 normal. Exam reveals no gallop and no friction rub.  No murmur heard. Pulmonary/Chest: Effort normal and breath sounds normal. No respiratory distress. He exhibits no tenderness.  Abdominal: Soft. Normal appearance and bowel sounds are normal. There is no hepatosplenomegaly. There is no tenderness. There is no rebound, no guarding, no tenderness at McBurney's point and negative Murphy's sign. No hernia.  Musculoskeletal: Normal range of motion.  Neurological: He is alert and oriented to person, place, and time. He has normal strength. No cranial nerve deficit or sensory  deficit. Coordination normal. GCS eye subscore is 4. GCS verbal subscore is 5. GCS motor subscore is 6.  Skin: Skin is warm, dry and intact. No rash noted. No cyanosis.  Multiple scabs over extremities (sister reports that he picks at his skin) no erythema or signs of infection  Psychiatric: He has a normal mood and affect. His speech is normal and behavior is normal. Thought content normal.  Nursing note and vitals reviewed.    ED Treatments / Results  Labs (all labs ordered are listed, but only abnormal results are displayed) Labs Reviewed  COMPREHENSIVE METABOLIC PANEL - Abnormal; Notable for the following components:      Result Value   Potassium 3.4 (*)    Chloride 100 (*)  Glucose, Bld 106 (*)    Creatinine, Ser 3.88 (*)    Calcium 7.7 (*)    Albumin 2.9 (*)    Alkaline Phosphatase 568 (*)    GFR calc non Af Amer 17 (*)    GFR calc Af Amer 20 (*)    All other components within normal limits  CBC - Abnormal; Notable for the following components:   Hemoglobin 11.9 (*)    HCT 36.7 (*)    RDW 17.6 (*)    All other components within normal limits  LIPASE, BLOOD  URINALYSIS, ROUTINE W REFLEX MICROSCOPIC    EKG  EKG Interpretation  Date/Time:  Friday January 11 2017 18:39:04 EST Ventricular Rate:  91 PR Interval:  188 QRS Duration: 100 QT Interval:  392 QTC Calculation: 482 R Axis:   -43 Text Interpretation:  Normal sinus rhythm Possible Left atrial enlargement Left axis deviation Nonspecific ST and T wave abnormality Prolonged QT Abnormal ECG No significant change since last tracing Confirmed by Orpah Greek [phone] on 01/11/2017 11:52:29 PM       Radiology Ct Head Wo Contrast  Result Date: 01/12/2017 CLINICAL DATA:  Altered level of consciousness. Dizziness leading to fall. EXAM: CT HEAD WITHOUT CONTRAST TECHNIQUE: Contiguous axial images were obtained from the base of the skull through the vertex without intravenous contrast. COMPARISON:  None.  FINDINGS: Brain: Mild generalized atrophy for age. No intracranial hemorrhage, mass effect, or midline shift. No hydrocephalus. The basilar cisterns are patent. No evidence of territorial infarct or acute ischemia. No extra-axial or intracranial fluid collection. Vascular: Atherosclerosis of skullbase vasculature without hyperdense vessel or abnormal calcification. Skull: No fracture or focal lesion. Sinuses/Orbits: Paranasal sinuses and mastoid air cells are clear. The visualized orbits are unremarkable. Probable cataract resection on the left. Other: Subcutaneous calcifications suggest diabetes. IMPRESSION: No acute intracranial abnormality. Electronically Signed   By: Jeb Levering M.D.   On: 01/12/2017 00:34   Dg Abd Acute W/chest  Result Date: 01/12/2017 CLINICAL DATA:  Vomiting. EXAM: DG ABDOMEN ACUTE W/ 1V CHEST COMPARISON:  CT 01/01/2017, radiograph 11/27/2016 FINDINGS: Right-sided dialysis catheter remains in place. Elevated right hemidiaphragm with right pleural effusion. There is cardiomegaly and vascular congestion. Increased air throughout small and large bowel in a nonobstructive pattern appears similar to prior exam. No free air. Small colonic stool burden. Extensive vascular calcifications. The bones are under mineralized. IMPRESSION: 1. Cardiomegaly with right pleural effusion and vascular congestion. 2. No bowel obstruction or free air. Increased air throughout nondilated bowel is similar to prior exams, enteritis could have this appearance. Electronically Signed   By: Jeb Levering M.D.   On: 01/12/2017 00:48    Procedures Procedures (including critical care time)  Medications Ordered in ED Medications - No data to display   Initial Impression / Assessment and Plan / ED Course  I have reviewed the triage vital signs and the nursing notes.  Pertinent labs & imaging results that were available during my care of the patient were reviewed by me and considered in my medical  decision making (see chart for details).     Patient brought to the emergency department at request of sister who he currently lives with.  Sister reports that he has been with her for a couple of months and has progressively declined.  She reports that she is all alone, caring for him and she can no longer care for him.  She wants him placed in a nursing home.  Patient appears chronically ill, but does  not appear to have any acute illness that would require hospitalization at this time.  Sister reports that he has had vomiting and diarrhea, but this has not been seen in the ER and he has no complaints at this time.  His workup has been unremarkable.  Sister has left the emergency department.  She reports that she can no longer care for him, will ask social work to evaluate his case.  Final Clinical Impressions(s) / ED Diagnoses   Final diagnoses:  ESRD (end stage renal disease) on dialysis [facility])  Weakness generalized    ED Discharge Orders    None       Orpah Greek, MD 01/12/17 [phone]

## 2017-01-12 NOTE — ED Notes (Addendum)
Pt O2 sats noted to drop to 74, woke up patient. Improved to 94%. Pt refuses to keep cardiac monitor on.

## 2017-01-12 NOTE — ED Notes (Signed)
Patient transported to CT 

## 2017-01-13 ENCOUNTER — Emergency Department (HOSPITAL_COMMUNITY): Payer: Medicare Other

## 2017-01-13 ENCOUNTER — Emergency Department (HOSPITAL_COMMUNITY): Payer: Medicare Other | Admitting: Certified Registered Nurse Anesthetist

## 2017-01-13 ENCOUNTER — Encounter (HOSPITAL_COMMUNITY)
Admission: EM | Disposition: A | Discharge status: Skilled Nursing Facility | Payer: Self-pay | Source: Home / Self Care | Attending: Internal Medicine

## 2017-01-13 DIAGNOSIS — R0902 Hypoxemia: Secondary | ICD-10-CM

## 2017-01-13 DIAGNOSIS — N186 End stage renal disease: Secondary | ICD-10-CM

## 2017-01-13 DIAGNOSIS — R296 Repeated falls: Secondary | ICD-10-CM | POA: Diagnosis not present

## 2017-01-13 DIAGNOSIS — Z992 Dependence on renal dialysis: Secondary | ICD-10-CM

## 2017-01-13 DIAGNOSIS — Z95828 Presence of other vascular implants and grafts: Secondary | ICD-10-CM | POA: Diagnosis not present

## 2017-01-13 DIAGNOSIS — L989 Disorder of the skin and subcutaneous tissue, unspecified: Secondary | ICD-10-CM

## 2017-01-13 HISTORY — PX: INSERTION OF DIALYSIS CATHETER: SHX1324

## 2017-01-13 LAB — CBC
HEMATOCRIT: 35.3 % — AB (ref 39.0–52.0)
HEMOGLOBIN: 11.3 g/dL — AB (ref 13.0–17.0)
MCH: 27.2 pg (ref 26.0–34.0)
MCHC: 32 g/dL (ref 30.0–36.0)
MCV: 85.1 fL (ref 78.0–100.0)
Platelets: 291 10*3/uL (ref 150–400)
RBC: 4.15 MIL/uL — ABNORMAL LOW (ref 4.22–5.81)
RDW: 17.3 % — ABNORMAL HIGH (ref 11.5–15.5)
WBC: 10.7 10*3/uL — ABNORMAL HIGH (ref 4.0–10.5)

## 2017-01-13 LAB — RENAL FUNCTION PANEL
Albumin: 2.5 g/dL — ABNORMAL LOW (ref 3.5–5.0)
Anion gap: 13 (ref 5–15)
BUN: 40 mg/dL — ABNORMAL HIGH (ref 6–20)
CO2: 21 mmol/L — ABNORMAL LOW (ref 22–32)
Calcium: 8.1 mg/dL — ABNORMAL LOW (ref 8.9–10.3)
Chloride: 98 mmol/L — ABNORMAL LOW (ref 101–111)
Creatinine, Ser: 6.47 mg/dL — ABNORMAL HIGH (ref 0.61–1.24)
GFR calc Af Amer: 11 mL/min — ABNORMAL LOW (ref >60)
GFR calc non Af Amer: 9 mL/min — ABNORMAL LOW (ref >60)
Glucose, Bld: 99 mg/dL (ref 65–99)
Phosphorus: 6.1 mg/dL — ABNORMAL HIGH (ref 2.5–4.6)
Potassium: 4.8 mmol/L (ref 3.5–5.1)
Sodium: 132 mmol/L — ABNORMAL LOW (ref 135–145)

## 2017-01-13 LAB — GLUCOSE, CAPILLARY
GLUCOSE-CAPILLARY: 111 mg/dL — AB (ref 65–99)
Glucose-Capillary: 97 mg/dL (ref 65–99)
Glucose-Capillary: 110 mg/dL — ABNORMAL HIGH (ref 65–99)

## 2017-01-13 LAB — CREATININE, SERUM
Creatinine, Ser: 7.22 mg/dL — ABNORMAL HIGH (ref 0.61–1.24)
GFR calc Af Amer: 9 mL/min — ABNORMAL LOW (ref >60)
GFR calc non Af Amer: 8 mL/min — ABNORMAL LOW (ref >60)

## 2017-01-13 LAB — CBG MONITORING, ED: GLUCOSE-CAPILLARY: 95 mg/dL (ref 65–99)

## 2017-01-13 SURGERY — INSERTION OF DIALYSIS CATHETER
Anesthesia: General | Laterality: Right

## 2017-01-13 MED ORDER — SODIUM CHLORIDE 0.9 % IV SOLN
INTRAVENOUS | Status: DC | PRN
  Administered 2017-01-13: 10:00:00 via INTRAVENOUS

## 2017-01-13 MED ORDER — HEPARIN SODIUM (PORCINE) 1000 UNIT/ML IJ SOLN
INTRAMUSCULAR | Status: DC | PRN
  Administered 2017-01-13: 1000 [IU]

## 2017-01-13 MED ORDER — HYDROCERIN EX CREA
TOPICAL_CREAM | Freq: Two times a day (BID) | CUTANEOUS | Status: DC
  Administered 2017-01-13 – 2017-01-18 (×8): via TOPICAL
  Filled 2017-01-13: qty 113

## 2017-01-13 MED ORDER — FENTANYL CITRATE (PF) 250 MCG/5ML IJ SOLN
INTRAMUSCULAR | Status: AC
  Filled 2017-01-13: qty 5

## 2017-01-13 MED ORDER — PHENYLEPHRINE HCL 10 MG/ML IJ SOLN
INTRAMUSCULAR | Status: DC | PRN
  Administered 2017-01-13 (×5): 80 ug via INTRAVENOUS

## 2017-01-13 MED ORDER — FENTANYL CITRATE (PF) 100 MCG/2ML IJ SOLN
INTRAMUSCULAR | Status: DC | PRN
  Administered 2017-01-13: 50 ug via INTRAVENOUS
  Administered 2017-01-13: 25 ug via INTRAVENOUS

## 2017-01-13 MED ORDER — DIPHENHYDRAMINE HCL 50 MG/ML IJ SOLN
INTRAMUSCULAR | Status: AC
  Filled 2017-01-13: qty 1

## 2017-01-13 MED ORDER — ACETAMINOPHEN 325 MG PO TABS: 650.0000 mg | ORAL_TABLET | Freq: Four times a day (QID) | ORAL | Status: DC | PRN

## 2017-01-13 MED ORDER — OXYCODONE-ACETAMINOPHEN 5-325 MG PO TABS: 1.0000 | ORAL_TABLET | ORAL | Status: DC | PRN

## 2017-01-13 MED ORDER — LABETALOL HCL 5 MG/ML IV SOLN
INTRAVENOUS | Status: AC
  Filled 2017-01-13: qty 4

## 2017-01-13 MED ORDER — FENTANYL CITRATE (PF) 100 MCG/2ML IJ SOLN: 25.0000 ug | INTRAMUSCULAR | Status: DC | PRN

## 2017-01-13 MED ORDER — SODIUM CHLORIDE 0.9 % IV SOLN
INTRAVENOUS | Status: DC | PRN
  Administered 2017-01-13: 10:00:00

## 2017-01-13 MED ORDER — MIDAZOLAM HCL 2 MG/2ML IJ SOLN
INTRAMUSCULAR | Status: AC
  Filled 2017-01-13: qty 2

## 2017-01-13 MED ORDER — HEPARIN SODIUM (PORCINE) 5000 UNIT/ML IJ SOLN
5000.0000 [IU] | Freq: Three times a day (TID) | INTRAMUSCULAR | Status: DC
  Administered 2017-01-15 – 2017-01-18 (×9): 5000 [IU] via SUBCUTANEOUS
  Filled 2017-01-13 (×9): qty 1

## 2017-01-13 MED ORDER — ACETAMINOPHEN 650 MG RE SUPP: 650.0000 mg | Freq: Four times a day (QID) | RECTAL | Status: DC | PRN

## 2017-01-13 MED ORDER — EPHEDRINE SULFATE 50 MG/ML IJ SOLN
INTRAMUSCULAR | Status: DC | PRN
  Administered 2017-01-13 (×2): 10 mg via INTRAVENOUS

## 2017-01-13 MED ORDER — DIPHENHYDRAMINE HCL 50 MG/ML IJ SOLN
25.0000 mg | Freq: Once | INTRAMUSCULAR | Status: AC
  Administered 2017-01-13: 25 mg via INTRAVENOUS

## 2017-01-13 MED ORDER — CEFAZOLIN SODIUM-DEXTROSE 1-4 GM/50ML-% IV SOLN
1.0000 g | INTRAVENOUS | Status: AC
  Administered 2017-01-13: 1 g via INTRAVENOUS
  Filled 2017-01-13: qty 50

## 2017-01-13 MED ORDER — PROPOFOL 10 MG/ML IV BOLUS
INTRAVENOUS | Status: AC
  Filled 2017-01-13: qty 20

## 2017-01-13 MED ORDER — 0.9 % SODIUM CHLORIDE (POUR BTL) OPTIME
TOPICAL | Status: DC | PRN
  Administered 2017-01-13: 1000 mL

## 2017-01-13 MED ORDER — ASPIRIN EC 81 MG PO TBEC
81.0000 mg | DELAYED_RELEASE_TABLET | Freq: Every day | ORAL | Status: DC
  Filled 2017-01-13: qty 1

## 2017-01-13 MED ORDER — HEPARIN SODIUM (PORCINE) 1000 UNIT/ML IJ SOLN
INTRAMUSCULAR | Status: AC
  Filled 2017-01-13: qty 1

## 2017-01-13 MED ORDER — PROPOFOL 10 MG/ML IV BOLUS
INTRAVENOUS | Status: DC | PRN
  Administered 2017-01-13: 120 mg via INTRAVENOUS

## 2017-01-13 MED ORDER — LABETALOL HCL 5 MG/ML IV SOLN
10.0000 mg | INTRAVENOUS | Status: DC | PRN
  Administered 2017-01-13: 10 mg via INTRAVENOUS

## 2017-01-13 MED ORDER — LIDOCAINE HCL (CARDIAC) 20 MG/ML IV SOLN
INTRAVENOUS | Status: DC | PRN
  Administered 2017-01-13: 60 mg via INTRAVENOUS

## 2017-01-13 MED ORDER — HYDROXYZINE HCL 10 MG PO TABS
10.0000 mg | ORAL_TABLET | Freq: Three times a day (TID) | ORAL | Status: DC
  Administered 2017-01-13: 10 mg via ORAL
  Filled 2017-01-13: qty 1

## 2017-01-13 MED ORDER — MIDAZOLAM HCL 5 MG/5ML IJ SOLN
INTRAMUSCULAR | Status: DC | PRN
  Administered 2017-01-13: 1 mg via INTRAVENOUS

## 2017-01-13 MED ORDER — TRAMADOL HCL 50 MG PO TABS
50.0000 mg | ORAL_TABLET | Freq: Two times a day (BID) | ORAL | Status: DC | PRN
  Administered 2017-01-15 – 2017-01-18 (×2): 50 mg via ORAL

## 2017-01-13 MED ORDER — ONDANSETRON HCL 4 MG/2ML IJ SOLN
INTRAMUSCULAR | Status: DC | PRN
  Administered 2017-01-13: 4 mg via INTRAVENOUS

## 2017-01-13 SURGICAL SUPPLY — 37 items
BAG DECANTER FOR FLEXI CONT (MISCELLANEOUS) ×2 IMPLANT
BIOPATCH RED 1 DISK 7.0 (GAUZE/BANDAGES/DRESSINGS) ×2 IMPLANT
CATH PALINDROME RT-P 15FX23CM (CATHETERS) ×2 IMPLANT
CHLORAPREP W/TINT 26ML (MISCELLANEOUS) ×2 IMPLANT
COVER PROBE W GEL 5X96 (DRAPES) ×2 IMPLANT
COVER SURGICAL LIGHT HANDLE (MISCELLANEOUS) ×2 IMPLANT
DERMABOND ADVANCED (GAUZE/BANDAGES/DRESSINGS) ×1
DERMABOND ADVANCED .7 DNX12 (GAUZE/BANDAGES/DRESSINGS) ×1 IMPLANT
DEVICE TORQUE KENDALL .025-038 (MISCELLANEOUS) ×2 IMPLANT
DRAPE C-ARM 42X72 X-RAY (DRAPES) ×2 IMPLANT
DRAPE CHEST BREAST 15X10 FENES (DRAPES) ×2 IMPLANT
GAUZE SPONGE 4X4 16PLY XRAY LF (GAUZE/BANDAGES/DRESSINGS) ×2 IMPLANT
GLOVE BIO SURGEON STRL SZ7.5 (GLOVE) ×2 IMPLANT
GLOVE BIOGEL PI IND STRL 7.0 (GLOVE) ×4 IMPLANT
GLOVE BIOGEL PI IND STRL 8 (GLOVE) ×1 IMPLANT
GLOVE BIOGEL PI INDICATOR 7.0 (GLOVE) ×4
GLOVE BIOGEL PI INDICATOR 8 (GLOVE) ×1
GLOVE SURG SS PI 6.5 STRL IVOR (GLOVE) ×4 IMPLANT
GOWN STRL REUS W/ TWL LRG LVL3 (GOWN DISPOSABLE) ×2 IMPLANT
GOWN STRL REUS W/TWL LRG LVL3 (GOWN DISPOSABLE) ×2
GUIDEWIRE ANGLED .035X150CM (WIRE) ×2 IMPLANT
KIT BASIN OR (CUSTOM PROCEDURE TRAY) ×2 IMPLANT
KIT ROOM TURNOVER OR (KITS) ×2 IMPLANT
NEEDLE 18GX1X1/2 (RX/OR ONLY) (NEEDLE) ×2 IMPLANT
NEEDLE HYPO 25GX1X1/2 BEV (NEEDLE) ×2 IMPLANT
NS IRRIG 1000ML POUR BTL (IV SOLUTION) ×2 IMPLANT
PACK SURGICAL SETUP 50X90 (CUSTOM PROCEDURE TRAY) ×2 IMPLANT
PAD ARMBOARD 7.5X6 YLW CONV (MISCELLANEOUS) ×4 IMPLANT
SUT ETHILON 3 0 PS 1 (SUTURE) ×2 IMPLANT
SUT VICRYL 4-0 PS2 18IN ABS (SUTURE) ×2 IMPLANT
SYR 10ML LL (SYRINGE) ×2 IMPLANT
SYR 20CC LL (SYRINGE) ×4 IMPLANT
SYR 5ML LL (SYRINGE) ×4 IMPLANT
SYR CONTROL 10ML LL (SYRINGE) ×2 IMPLANT
TOWEL GREEN STERILE (TOWEL DISPOSABLE) ×2 IMPLANT
TOWEL GREEN STERILE FF (TOWEL DISPOSABLE) ×2 IMPLANT
WATER STERILE IRR 1000ML POUR (IV SOLUTION) ×2 IMPLANT

## 2017-01-13 NOTE — Progress Notes (Signed)
Patient admitted from E.D. Awake,alert and oriented x 3 Skin  assessed with Haywood Lasso R.N. He has a healed wound on his sacral area.On his left knee he has two non pressure wounds,no drainage with 100% granulations on his left knee measuring 4 x 3 x.2 and 4.25 x 3 x.2 .On his right knee. A non pressure wound  2 x 3 x .2 with slight serous non smelling drainage.Patient become irritated and refused to change dressing on the right knee.

## 2017-01-13 NOTE — ED Notes (Signed)
Pt noted w/multiple abrasions to lower legs from pt scratching - covered w/gauze. Pt alert, oriented. Pt aware NPO and verbalized understanding of procedure to be performed today - Placement of tunneled dialysis catheter. Pt states his fiance is his POA and she lives in [place]. States was living by himself in [place] then moved to [facility] and lives w/his sister.

## 2017-01-13 NOTE — Progress Notes (Signed)
CSW spoke with patient's POA, Wyonia Hough in [place] to discuss case further. CSW asked POA if there were private pay funds available for help with placement of this patient, she stated that it depended on how much it costs due to him having other bills. CSW informed POA that healthcare is first priority and this patient is too medically fragile to be concerned with other financial obligations at this time. CSW informed POA  That funds need to be prioritized and allocated to the appropriate places to care for him. 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 Romagnoli to minimize his expenses, trying to sell his car and alleviate some of the spending.   POA asked CSW to note the time difference and that she works 7:30am-4pm and would prefer to be contacted via email or text message during that time to ensure a quick response time. Darlene's email is [email or web address] and phone number is [phone].  Madilyn Fireman, MSW, LCSW-A Weekend Clinical Social Worker [phone]

## 2017-01-13 NOTE — Anesthesia Preprocedure Evaluation (Signed)
Anesthesia Evaluation  Patient identified by MRN, date of birth, ID band Patient awake    Reviewed: Allergy & Precautions, H&P , NPO status , Patient's Chart, lab work & pertinent test results  Airway Mallampati: II  TM Distance: >3 FB Neck ROM: Full    Dental no notable dental hx. (+) Teeth Intact, Dental Advisory Given   Pulmonary neg pulmonary ROS,    Pulmonary exam normal breath sounds clear to auscultation       Cardiovascular hypertension,  Rhythm:Regular Rate:Normal     Neuro/Psych negative neurological ROS  negative psych ROS   GI/Hepatic negative GI ROS, Neg liver ROS,   Endo/Other  diabetes, Insulin Dependent  Renal/GU ESRF and DialysisRenal disease  negative genitourinary   Musculoskeletal   Abdominal   Peds  Hematology negative hematology ROS (+)   Anesthesia Other Findings   Reproductive/Obstetrics negative OB ROS                             Anesthesia Physical Anesthesia Plan  ASA: III  Anesthesia Plan: General   Post-op Pain Management:    Induction: Intravenous  PONV Risk Score and Plan: Ondansetron, Dexamethasone and Midazolam  Airway Management Planned: LMA  Additional Equipment:   Intra-op Plan:   Post-operative Plan: Extubation in OR  Informed Consent: I have reviewed the patients History and Physical, chart, labs and discussed the procedure including the risks, benefits and alternatives for the proposed anesthesia with the patient or authorized representative who has indicated his/her understanding and acceptance.   Dental advisory given  Plan Discussed with: CRNA  Anesthesia Plan Comments:         Anesthesia Quick Evaluation

## 2017-01-13 NOTE — ED Notes (Signed)
Pt woke up yelling and scratching. He was asking for something to help the itch. MD made aware

## 2017-01-13 NOTE — Progress Notes (Signed)
Follow up call to On Call RN for [facility]- Dr Conley Simmonds will be admitting MD- Dr Clydene Laming to contact Dr Scot Dock directly

## 2017-01-13 NOTE — Progress Notes (Signed)
PROGRESS NOTE    [name]  GYI:[id number] DOB: 1970/07/18 DOA: 01/11/2017 PCP: Vivi Barrack, MD     Brief Narrative:  Philip West is a 46 y.o. male   moved from [place] several months ago. Supposedly patient live alone in [place] however has girlfriend who is his HCPOA in [place].   PMHx ESRD on HD M,/W/F, frequent falls DM Type 2, Essential HTN, Pancreatic Tumor. Anoxic brain injury (found down by [facility] in [place] in February),   Per patient has had diarrhea for 10 years (chronic). Negative CP, negative SOB, negative abdominal pain, negative N/V. States does not use home O2. Patient stated approximate 2 weeks ago had rash on his torso arms pustular in nature, extremely itchy.    Subjective: 11/25  A/O 2 (does not know where, when), does know he is in the hospital, negative CP, negative SOB, negative N/V, negative abdominal pain. Continued pruritus    Assessment & Plan:   Active Problems:   * No active hospital problems. *   Anoxic brain injury -Patient appears at baseline although clearly has gaps in his ability to reason and memory is extremely high functioning answers questions appropriately. No reason for inpatient admission -Schedule follow-up appointment with Dr. [name], [facility] in 2-3 days anoxic brain injury, frequent falls. Patient requires placement in Neuro SNF    Frequent falls -Most likely secondary to anoxic brain injury and deconditioning    Essential HTN -Controlled without medication   Hypoxia -Although patient states not on home O2 when he falls asleep becomes hypoxic. -Continue titrate O2 to maintain SPO2> 93%   Fever -Afebrile last 24 hours    ESRD on HD M,/W/F, -HD on Monday. Contact Nephrology in the A.m. to ensure was placed on schedule. Nephrology is aware patient is admitted.  -Anticipate discharge following HD   Chronic Diarrhea -Baseline per patient  Acquired Perforating Dermatosis? -Eucerin cream -Atarax 10  mg TID   Goals of care -Extremely difficult situation patient has multiple medical problems unfortunately they're all chronic although patient did spike a one-time fever negative leukocytosis, negative bands negative left shift. Currently unable to come up with diagnosis requiring admission to hospital. Discussed case extensively with Dr. Virgel Manifold, and LCSW Kingsley Spittle. In this situation if we admitted patient it would be for overnight as he does not have retrieved diagnosis for admission. Patient would wind up being discharged in the A.m. this would not help with placement of patient to SNF as he would require an inpatient admission and stay of at least 2 days. -Spoke at length with sister and HCPOA and explained why patient did not meet criteria for inpatient admission.      DVT prophylaxis: Subcutaneous heparin Code Status: Full Family Communication: Spoke with sister and HCPOA (via phone) discuss plan of care Disposition Plan: Discharge after HD on 11/26   Consultants:  Vascular surgery Nephrology   Procedures/Significant Events:  11/25 RIGHT sided tunneled HD cath    I have personally reviewed and interpreted all radiology studies and my findings are as above.  VENTILATOR SETTINGS:    Cultures   Antimicrobials:    Devices    LINES / TUBES:  11/25 RIGHT sided tunneled HD cath>>    Continuous Infusions:   Objective: Vitals:   01/13/17 1306 01/13/17 1321 01/13/17 1336 01/13/17 1351  BP: 93/69 100/72 106/88 102/82  Pulse: 94 95 95 95  Resp: (!) 8 11 16 17   Temp:      TempSrc:  SpO2: (!) 88% 100% 96% 100%  Weight:      Height:        Intake/Output Summary (Last 24 hours) at 01/13/2017 1436 Last data filed at 01/13/2017 1400 Gross per 24 hour  Intake 572 ml  Output 5 ml  Net 567 ml   Filed Weights   01/11/17 1828  Weight: 191 lb (86.6 kg)    Examination:  General: A/O 2 (does not know where, when), does know he is in the  hospital, No acute respiratory distress, cachectic Eyes: negative scleral hemorrhage, negative anisocoria, negative icterus ENT: Negative Runny nose, negative gingival bleeding, Neck:  Negative scars, masses, torticollis, lymphadenopathy, JVD Lungs: Clear to auscultation bilaterally without wheezes or crackles Cardiovascular: Regular rate and rhythm without murmur gallop or rub normal S1 and S2 Abdomen: negative abdominal pain, nondistended, positive soft, bowel sounds, no rebound, no ascites, no appreciable mass Extremities: No significant cyanosis, clubbing, or edema bilateral lower extremities Skin: Healing multiple lesions on torso consistent with possible healing chickenpox. Psychiatric:  Negative depression, negative anxiety, negative fatigue, negative mania  Central nervous system:  Cranial nerves II through XII intact, tongue/uvula midline, all extremities muscle strength 5/5, sensation intact throughout,  negative dysarthria, negative expressive aphasia, negative receptive aphasia.     .     Data Reviewed: Care during the described time interval was provided by me .  I have reviewed this patient's available data, including medical history, events of note, physical examination, and all test results as part of my evaluation.  CBC: Recent Labs  Lab 01/11/17 1830  WBC 8.0  HGB 11.9*  HCT 36.7*  MCV 83.0  PLT 161   Basic Metabolic Panel: Recent Labs  Lab 01/11/17 1830 01/13/17 0654  NA 137 132*  K 3.4* 4.8  CL 100* 98*  CO2 22 21*  GLUCOSE 106* 99  BUN 19 40*  CREATININE 3.88* 6.47*  CALCIUM 7.7* 8.1*  PHOS  --  6.1*   GFR: Estimated Creatinine Clearance: 15.7 mL/min (A) (by C-G formula based on SCr of 6.47 mg/dL (H)). Liver Function Tests: Recent Labs  Lab 01/11/17 1830 01/13/17 0654  AST 41  --   ALT 44  --   ALKPHOS 568*  --   BILITOT 1.2  --   PROT 6.9  --   ALBUMIN 2.9* 2.5*   Recent Labs  Lab 01/11/17 1830  LIPASE 18   No results for input(s):  AMMONIA in the last 168 hours. Coagulation Profile: No results for input(s): INR, PROTIME in the last 168 hours. Cardiac Enzymes: No results for input(s): CKTOTAL, CKMB, CKMBINDEX, TROPONINI in the last 168 hours. BNP (last 3 results) No results for input(s): PROBNP in the last 8760 hours. HbA1C: No results for input(s): HGBA1C in the last 72 hours. CBG: Recent Labs  Lab 01/12/17 1234 01/12/17 1819 01/12/17 2217 01/13/17 0706 01/13/17 1112  GLUCAP 122* 107* 116* 95 97   Lipid Profile: No results for input(s): CHOL, HDL, LDLCALC, TRIG, CHOLHDL, LDLDIRECT in the last 72 hours. Thyroid Function Tests: No results for input(s): TSH, T4TOTAL, FREET4, T3FREE, THYROIDAB in the last 72 hours. Anemia Panel: No results for input(s): VITAMINB12, FOLATE, FERRITIN, TIBC, IRON, RETICCTPCT in the last 72 hours. Sepsis Labs: No results for input(s): PROCALCITON, LATICACIDVEN in the last 168 hours.  No results found for this or any previous visit (from the past 240 hour(s)).       Radiology Studies: Ct Head Wo Contrast  Result Date: 01/12/2017 CLINICAL DATA:  Altered level  of consciousness. Dizziness leading to fall. EXAM: CT HEAD WITHOUT CONTRAST TECHNIQUE: Contiguous axial images were obtained from the base of the skull through the vertex without intravenous contrast. COMPARISON:  None. FINDINGS: Brain: Mild generalized atrophy for age. No intracranial hemorrhage, mass effect, or midline shift. No hydrocephalus. The basilar cisterns are patent. No evidence of territorial infarct or acute ischemia. No extra-axial or intracranial fluid collection. Vascular: Atherosclerosis of skullbase vasculature without hyperdense vessel or abnormal calcification. Skull: No fracture or focal lesion. Sinuses/Orbits: Paranasal sinuses and mastoid air cells are clear. The visualized orbits are unremarkable. Probable cataract resection on the left. Other: Subcutaneous calcifications suggest diabetes. IMPRESSION: No  acute intracranial abnormality. Electronically Signed   By: Jeb Levering M.D.   On: 01/12/2017 00:34   Dg Chest Port 1 View  Result Date: 01/13/2017 CLINICAL DATA:  46 year old male status post dialysis catheter insertion. EXAM: PORTABLE CHEST 1 VIEW COMPARISON:  01/12/2017 FINDINGS: Right-sided dialysis catheter terminates in the right atrium. There is stable cardiomegaly and pulmonary vascular congestion. No focal parenchymal consolidation, sizable effusion or pneumothorax. No acute osseous abnormalities. IMPRESSION: Right-sided dialysis catheter terminating in the right atrium. Otherwise unchanged radiographic examination of the chest. Electronically Signed   By: Kristopher Oppenheim M.D.   On: 01/13/2017 11:28   Dg Abd Acute W/chest  Result Date: 01/12/2017 CLINICAL DATA:  Vomiting. EXAM: DG ABDOMEN ACUTE W/ 1V CHEST COMPARISON:  CT 01/01/2017, radiograph 11/27/2016 FINDINGS: Right-sided dialysis catheter remains in place. Elevated right hemidiaphragm with right pleural effusion. There is cardiomegaly and vascular congestion. Increased air throughout small and large bowel in a nonobstructive pattern appears similar to prior exam. No free air. Small colonic stool burden. Extensive vascular calcifications. The bones are under mineralized. IMPRESSION: 1. Cardiomegaly with right pleural effusion and vascular congestion. 2. No bowel obstruction or free air. Increased air throughout nondilated bowel is similar to prior exams, enteritis could have this appearance. Electronically Signed   By: Jeb Levering M.D.   On: 01/12/2017 00:48   Dg Fluoro Guide Cv Line-no Report  Result Date: 01/13/2017 Fluoroscopy was utilized by the requesting physician.  No radiographic interpretation.        Scheduled Meds: . [MAR Hold] cholestyramine  4 g Oral BID PC  . [MAR Hold] Folic Acid-Vit E2-AST M19  1 tablet Oral Daily  . [MAR Hold] insulin aspart  0-9 Units Subcutaneous TID WC  . [MAR Hold] insulin detemir   15 Units Subcutaneous Q0600   And  . [MAR Hold] insulin detemir  10 Units Subcutaneous QPM  . labetalol      . [MAR Hold] lipase/protease/amylase  24,000 Units Oral TID WC  . [MAR Hold] niacin  500 mg Oral QHS  . [MAR Hold] pantoprazole  40 mg Oral Daily   Continuous Infusions:   LOS: 0 days    Time spent:40 min    Laniece Hornbaker, Geraldo Docker, MD [facility] Pager [phone]  If 7PM-7AM, please contact night-coverage [email or web address] Password [facility] 01/13/2017, 2:36 PM

## 2017-01-13 NOTE — Interval H&P Note (Signed)
History and Physical Interval Note:  01/13/2017 9:45 AM  Philip West  has presented today for surgery, with the diagnosis of ESRD  The various methods of treatment have been discussed with the patient and family. After consideration of risks, benefits and other options for treatment, the patient has consented to  Procedure(s): INSERTION OF DIALYSIS CATHETER (N/A) as a surgical intervention .  The patient's history has been reviewed, patient examined, no change in status, stable for surgery.  I have reviewed the patient's chart and labs.  Questions were answered to the patient's satisfaction.     Deitra Mayo

## 2017-01-13 NOTE — Progress Notes (Signed)
Per Dr Scot Dock - pt to go back to ER Pod F for placement s/p catheter- Dr Scot Dock confirmed with ER via phone

## 2017-01-13 NOTE — ED Notes (Addendum)
Consent form and Ancef sent to OR w/pt - to [facility].

## 2017-01-13 NOTE — Anesthesia Postprocedure Evaluation (Signed)
Anesthesia Post Note  Patient: Philip West  Procedure(s) Performed: INSERTION OF DIALYSIS CATHETER - Right Internal Jugular Placement (Right )     Patient location during evaluation: PACU Anesthesia Type: General Level of consciousness: awake and alert Pain management: pain level controlled Vital Signs Assessment: post-procedure vital signs reviewed and stable Respiratory status: spontaneous breathing, nonlabored ventilation, respiratory function stable and patient connected to nasal cannula oxygen Cardiovascular status: blood pressure returned to baseline and stable Postop Assessment: no apparent nausea or vomiting Anesthetic complications: no    Last Vitals:  Vitals:   01/13/17 1430 01/13/17 1436  BP:  108/78  Pulse: 98 97  Resp: 10 16  Temp:    SpO2: 97% 97%    Last Pain:  Vitals:   01/13/17 1400  TempSrc:   PainSc: Asleep                 Leia Coletti,W. EDMOND

## 2017-01-13 NOTE — Progress Notes (Signed)
BP q 30 min while pt asleep and awaiting orders

## 2017-01-13 NOTE — Transfer of Care (Signed)
Immediate Anesthesia Transfer of Care Note  Patient: Philip West  Procedure(s) Performed: INSERTION OF DIALYSIS CATHETER - Right Internal Jugular Placement (Right )  Patient Location: PACU  Anesthesia Type:General  Level of Consciousness: drowsy and patient cooperative  Airway & Oxygen Therapy: Patient Spontanous Breathing and Patient connected to face mask oxygen  Post-op Assessment: Report given to RN and Post -op Vital signs reviewed and stable  Post vital signs: Reviewed and stable  Last Vitals:  Vitals:   01/12/17 2213 01/13/17 0706  BP: 122/89 113/86  Pulse: 82 (!) 107  Resp: 16 18  Temp: 37.5 C 37.4 C  SpO2: 100% 97%    Last Pain:  Vitals:   01/13/17 0809  TempSrc:   PainSc: 0-No pain         Complications: No apparent anesthesia complications

## 2017-01-13 NOTE — Op Note (Signed)
01/13/2017  PREOP DIAGNOSIS: Chronic kidney disease  POSTOP DIAGNOSIS: Chronic kidney disease  PROCEDURE: Ultrasound guided placement of right IJ tunneled dialysis catheter  (23 cm)  SURGEON: Judeth Cornfield. Scot Dock, MD, FACS  ASSIST: none  ANESTHESIA: local with sedation   EBL: minimal  FINDINGS: patent right IJ  INDICATIONS: Pulled out his catheter   TECHNIQUE: The patient was taken to the operating room and sedated by anesthesia. The neck and upper chest were prepped and draped in the usual sterile fashion. After the skin was anesthetized with 1% lidocaine, and under ultrasound guidance, the right IJ was cannulated and a guidewire introduced into the superior vena cava under fluoroscopic control. The tract over the wire was dilated and then the dilator and peel-away sheath were passed over the wire and the wire and dilator removed. The catheter was passed through the peel-away sheath and positioned in the right atrium. The exit site for the catheter was selected and the skin anesthetized between the 2 areas. The catheter was then brought through the tunnel, cut to the appropriate length, and the distal ports were attached. Both ports withdrew easily, were then flushed with heparinized saline and filled with concentrated heparin. The catheter was secured at its exit site with a 3-0 nylon suture. The IJ cannulation site was closed with a 4-0 subcuticular stitch. A sterile dressing was applied. The patient tolerated the procedure well and was transferred to the recovery room in stable condition. All needle and sponge counts were correct.  Deitra Mayo, MD, FACS [facility]: 01/13/2017 DATE OF DICTATION: 01/13/2017

## 2017-01-13 NOTE — Progress Notes (Signed)
Paged Dr C. Sherral Hammers (who returned page with a follow up call) -will be admitting pt to renal floor for obs- awaiting orders

## 2017-01-13 NOTE — Progress Notes (Signed)
Patient rest of the body has Picker's nodules all over but most of them are intact.Bilteral lower extremities has dry flaky skin.

## 2017-01-13 NOTE — Progress Notes (Signed)
CXR complete.

## 2017-01-13 NOTE — Anesthesia Procedure Notes (Signed)
Procedure Name: LMA Insertion Date/Time: 01/13/2017 10:24 AM Performed by: Clearnce Sorrel, CRNA Pre-anesthesia Checklist: Patient identified, Emergency Drugs available, Suction available, Patient being monitored and Timeout performed Patient Re-evaluated:Patient Re-evaluated prior to induction Oxygen Delivery Method: Circle system utilized Preoxygenation: Pre-oxygenation with 100% oxygen Induction Type: IV induction LMA Size: 4.0 Number of attempts: 1 Placement Confirmation: positive ETCO2 and breath sounds checked- equal and bilateral Tube secured with: Tape Dental Injury: Teeth and Oropharynx as per pre-operative assessment

## 2017-01-13 NOTE — ED Notes (Signed)
Pt being transported to [facility] via stretcher.

## 2017-01-13 NOTE — Progress Notes (Addendum)
9:25am: CSW came to [facility] to speak with Jacqlyn Larsen, RN regarding patient. Patient currently in OR to replace dialysis catheter that he removed yesterday.   CSW to continue following for discharge planning.  10:30am: CSW received email from patient's POA containing a copy of his Medicare card. CSW to take to registration to get this loaded into chart. CSW attempted call to [place] to obtain copy of POA documentation, voicemail was left due to no answer.  10:45am: CSW placed call to Nathaniel Man, AD for case consultation. No answer, voicemail was left.  11:30am: CSW spoke with Nathaniel Man regarding patient. Mr. Rolena Infante suggested that CSW reach out to POA to determine if private pay options for placement were available or if she would be be able to get this patient back to [place] so that he can utilize his resources more appropriately.  CSW to continue following.   Madilyn Fireman, MSW, LCSW-A Weekend Clinical Social Worker [phone]

## 2017-01-13 NOTE — Progress Notes (Addendum)
CSW completed APS report with Carney Harder due to family not picking patient up in ED, POA financial concerns, and complicated family dynamics. CSW will continue to update with disposition plans and on updates with APS envolvement.   Kingsley Spittle, [facility] Emergency Room Clinical Social Worker [phone]

## 2017-01-14 ENCOUNTER — Encounter (HOSPITAL_COMMUNITY): Payer: Self-pay | Admitting: Vascular Surgery

## 2017-01-14 DIAGNOSIS — R1310 Dysphagia, unspecified: Secondary | ICD-10-CM | POA: Diagnosis present

## 2017-01-14 DIAGNOSIS — I4581 Long QT syndrome: Secondary | ICD-10-CM | POA: Diagnosis present

## 2017-01-14 DIAGNOSIS — Y712 Prosthetic and other implants, materials and accessory cardiovascular devices associated with adverse incidents: Secondary | ICD-10-CM | POA: Diagnosis present

## 2017-01-14 DIAGNOSIS — N186 End stage renal disease: Secondary | ICD-10-CM | POA: Diagnosis present

## 2017-01-14 DIAGNOSIS — I120 Hypertensive chronic kidney disease with stage 5 chronic kidney disease or end stage renal disease: Secondary | ICD-10-CM | POA: Diagnosis present

## 2017-01-14 DIAGNOSIS — E875 Hyperkalemia: Secondary | ICD-10-CM | POA: Diagnosis not present

## 2017-01-14 DIAGNOSIS — R296 Repeated falls: Secondary | ICD-10-CM | POA: Diagnosis present

## 2017-01-14 DIAGNOSIS — E871 Hypo-osmolality and hyponatremia: Secondary | ICD-10-CM | POA: Diagnosis present

## 2017-01-14 DIAGNOSIS — Z419 Encounter for procedure for purposes other than remedying health state, unspecified: Secondary | ICD-10-CM | POA: Diagnosis not present

## 2017-01-14 DIAGNOSIS — D490 Neoplasm of unspecified behavior of digestive system: Secondary | ICD-10-CM | POA: Diagnosis present

## 2017-01-14 DIAGNOSIS — Z992 Dependence on renal dialysis: Secondary | ICD-10-CM | POA: Diagnosis not present

## 2017-01-14 DIAGNOSIS — Z79899 Other long term (current) drug therapy: Secondary | ICD-10-CM | POA: Diagnosis not present

## 2017-01-14 DIAGNOSIS — E1122 Type 2 diabetes mellitus with diabetic chronic kidney disease: Secondary | ICD-10-CM | POA: Diagnosis present

## 2017-01-14 DIAGNOSIS — S069X9S Unspecified intracranial injury with loss of consciousness of unspecified duration, sequela: Secondary | ICD-10-CM | POA: Diagnosis not present

## 2017-01-14 DIAGNOSIS — R531 Weakness: Secondary | ICD-10-CM | POA: Diagnosis present

## 2017-01-14 DIAGNOSIS — G931 Anoxic brain damage, not elsewhere classified: Secondary | ICD-10-CM | POA: Diagnosis present

## 2017-01-14 DIAGNOSIS — T8241XA Breakdown (mechanical) of vascular dialysis catheter, initial encounter: Secondary | ICD-10-CM | POA: Diagnosis present

## 2017-01-14 DIAGNOSIS — Z794 Long term (current) use of insulin: Secondary | ICD-10-CM | POA: Diagnosis not present

## 2017-01-14 DIAGNOSIS — N2581 Secondary hyperparathyroidism of renal origin: Secondary | ICD-10-CM | POA: Diagnosis present

## 2017-01-14 DIAGNOSIS — R0902 Hypoxemia: Secondary | ICD-10-CM | POA: Diagnosis present

## 2017-01-14 DIAGNOSIS — Z95828 Presence of other vascular implants and grafts: Secondary | ICD-10-CM | POA: Diagnosis not present

## 2017-01-14 LAB — CBC
HEMATOCRIT: 35.2 % — AB (ref 39.0–52.0)
HEMOGLOBIN: 10.6 g/dL — AB (ref 13.0–17.0)
MCH: 25.9 pg — AB (ref 26.0–34.0)
MCHC: 30.1 g/dL (ref 30.0–36.0)
MCV: 86.1 fL (ref 78.0–100.0)
Platelets: 249 10*3/uL (ref 150–400)
RBC: 4.09 MIL/uL — AB (ref 4.22–5.81)
RDW: 17.2 % — ABNORMAL HIGH (ref 11.5–15.5)
WBC: 8 10*3/uL (ref 4.0–10.5)

## 2017-01-14 LAB — COMPREHENSIVE METABOLIC PANEL
ALBUMIN: 2.6 g/dL — AB (ref 3.5–5.0)
ALK PHOS: 337 U/L — AB (ref 38–126)
ALT: 21 U/L (ref 17–63)
ANION GAP: 16 — AB (ref 5–15)
AST: 20 U/L (ref 15–41)
BUN: 50 mg/dL — ABNORMAL HIGH (ref 6–20)
CHLORIDE: 98 mmol/L — AB (ref 101–111)
CO2: 19 mmol/L — AB (ref 22–32)
Calcium: 7.9 mg/dL — ABNORMAL LOW (ref 8.9–10.3)
Creatinine, Ser: 7.94 mg/dL — ABNORMAL HIGH (ref 0.61–1.24)
GFR calc Af Amer: 8 mL/min — ABNORMAL LOW (ref >60)
GFR calc non Af Amer: 7 mL/min — ABNORMAL LOW (ref >60)
GLUCOSE: 83 mg/dL (ref 65–99)
POTASSIUM: 5.8 mmol/L — AB (ref 3.5–5.1)
SODIUM: 133 mmol/L — AB (ref 135–145)
Total Bilirubin: 1.7 mg/dL — ABNORMAL HIGH (ref 0.3–1.2)
Total Protein: 6.1 g/dL — ABNORMAL LOW (ref 6.5–8.1)

## 2017-01-14 LAB — GLUCOSE, CAPILLARY
GLUCOSE-CAPILLARY: 110 mg/dL — AB (ref 65–99)
GLUCOSE-CAPILLARY: 89 mg/dL (ref 65–99)
Glucose-Capillary: 104 mg/dL — ABNORMAL HIGH (ref 65–99)
Glucose-Capillary: 96 mg/dL (ref 65–99)

## 2017-01-14 LAB — MAGNESIUM: Magnesium: 1.9 mg/dL (ref 1.7–2.4)

## 2017-01-14 MED ORDER — CAMPHOR-MENTHOL 0.5-0.5 % EX LOTN
TOPICAL_LOTION | CUTANEOUS | Status: DC | PRN
  Filled 2017-01-14: qty 222

## 2017-01-14 MED ORDER — PANTOPRAZOLE SODIUM 40 MG PO TBEC
40.0000 mg | DELAYED_RELEASE_TABLET | Freq: Every day | ORAL | Status: DC
  Administered 2017-01-15 – 2017-01-18 (×4): 40 mg via ORAL
  Filled 2017-01-14 (×4): qty 1

## 2017-01-14 MED ORDER — FERRIC CITRATE 1 GM 210 MG(FE) PO TABS
420.0000 mg | ORAL_TABLET | Freq: Three times a day (TID) | ORAL | Status: DC
  Administered 2017-01-14 – 2017-01-18 (×10): 420 mg via ORAL
  Filled 2017-01-14 (×13): qty 2

## 2017-01-14 MED ORDER — HYDROXYZINE HCL 10 MG PO TABS
10.0000 mg | ORAL_TABLET | Freq: Three times a day (TID) | ORAL | Status: DC
  Administered 2017-01-14 – 2017-01-18 (×10): 10 mg via ORAL
  Filled 2017-01-14 (×11): qty 1

## 2017-01-14 MED ORDER — FOLIC ACID-VIT B6-VIT B12 2.5-25-1 MG PO TABS
1.0000 | ORAL_TABLET | Freq: Every day | ORAL | Status: DC
  Administered 2017-01-15 – 2017-01-18 (×4): 1 via ORAL
  Filled 2017-01-14 (×4): qty 1

## 2017-01-14 MED ORDER — ASPIRIN EC 81 MG PO TBEC
81.0000 mg | DELAYED_RELEASE_TABLET | Freq: Every day | ORAL | Status: DC
  Administered 2017-01-14 – 2017-01-18 (×5): 81 mg via ORAL
  Filled 2017-01-14 (×5): qty 1

## 2017-01-14 MED ORDER — DOXERCALCIFEROL 4 MCG/2ML IV SOLN
5.0000 ug | INTRAVENOUS | Status: DC
  Administered 2017-01-16 – 2017-01-18 (×2): 5 ug via INTRAVENOUS
  Filled 2017-01-14 (×2): qty 4

## 2017-01-14 MED ORDER — DIPHENHYDRAMINE HCL 25 MG PO CAPS
25.0000 mg | ORAL_CAPSULE | Freq: Four times a day (QID) | ORAL | Status: DC | PRN
  Administered 2017-01-14 – 2017-01-16 (×2): 25 mg via ORAL
  Filled 2017-01-14 (×2): qty 1

## 2017-01-14 MED ORDER — SODIUM CHLORIDE 0.9 % IV SOLN
62.5000 mg | INTRAVENOUS | Status: DC
  Administered 2017-01-16: 62.5 mg via INTRAVENOUS
  Filled 2017-01-14 (×2): qty 5

## 2017-01-14 NOTE — Consult Note (Signed)
[facility] Renal Consultation Note  Indication for Consultation:  Management of ESRD/hemodialysis; anemia, hypertension/volume and secondary hyperparathyroidism  HPI: JHONY ANTRIM is a 46 y.o. male with ESRD on HD since 07/2016 started in [place]  - prior AKI with hx DM/HTN - not clear of precipitating event, anoxic brain injury nonambulatory, - transferred to [place] at [facility] 11/27/16 living with sister=Chantelle Mickens  who lives in 2nd story appt.and has to have neighbor carry pt up to apt . Per sister's  history. His POA  Lives in [place]. 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 Last hd was 01/11/17  On schedule at [facility].     Sp yesterday  Dr. Scot Dock reinsert Merwyn Katos cath .  K 5.8  cxr no excess vol     Past Medical History:  Diagnosis Date  . Allergy   . Anoxic brain injury ([place])   . Diabetes mellitus without complication ([place])   . Hyperlipidemia   . Hypertension   . Kidney failure   . Pancreatic tumor   . Picker's nodule   . Vipoma [facility])     Past Surgical History:  Procedure Laterality Date  . EYE SURGERY Left   . INSERTION OF DIALYSIS CATHETER Right 01/13/2017   Procedure: INSERTION OF DIALYSIS CATHETER - Right Internal Jugular Placement;  Surgeon: Angelia Mould, MD;  Location: [facility] OR;  Service: Vascular;  Laterality: Right;      Family History  Problem Relation Age of Onset  . Alcohol abuse Mother   . Depression Mother   . Early death Mother   . Asthma Sister   . Depression Sister   . Depression Daughter   . Arthritis Maternal Grandmother   . Diabetes Maternal Grandmother   . Heart disease Maternal Grandmother   . Hearing loss Maternal Grandmother       reports that  has never smoked. he has never used smokeless tobacco. He reports that he does not drink alcohol or use drugs.  No Known Allergies  Prior to  Admission medications   Medication Sig Start Date End Date Taking? Authorizing Provider  b complex-vitamin c-folic acid (NEPHRO-VITE) 0.8 MG TABS tablet Take 1 tablet by mouth daily. 12/11/16  Yes Vivi Barrack, MD  cholestyramine Lucrezia Starch) 4 g packet Take 1 packet (4 g total) by mouth 2 (two) times daily. 12/11/16  Yes Vivi Barrack, MD  Cyanocobalamin (VITAMIN B 12) 100 MCG LOZG Take 1 by mouth daily 12/11/16  Yes Vivi Barrack, MD  folic acid-pyridoxine-cyancobalamin (FOLTX) 2.5-25-2 MG TABS tablet Take 1 tablet by mouth daily. 12/11/16  Yes Vivi Barrack, MD  insulin detemir (LEVEMIR) 100 UNIT/ML injection Inject 0.1-0.15 mLs (10-15 Units total) into the skin 2 (two) times daily. 15 units at 6 am and 10 units at 6 pm 12/11/16  Yes Vivi Barrack, MD  lipase/protease/amylase (CREON) 12000 units CPEP capsule Take 2 capsules (24,000 Units total) by mouth 3 (three) times daily with meals. 12/11/16  Yes Vivi Barrack, MD  loperamide (IMODIUM A-D) 2 MG tablet Take 1 tablet (2 mg total) by mouth 4 (four) times daily as needed for diarrhea or loose stools. 12/11/16  Yes Vivi Barrack, MD  niacin 500 MG tablet Take 1 tablet (500 mg total) by mouth at bedtime. 12/11/16  Yes Vivi Barrack, MD  pantoprazole (PROTONIX) 40 MG tablet Take 1 tablet (40  mg total) by mouth daily. 12/11/16  Yes Vivi Barrack, MD  thiamine (VITAMIN B-1) 100 MG tablet Take 1 tablet (100 mg total) by mouth daily. 12/11/16  Yes Vivi Barrack, MD  insulin lispro (HUMALOG) 100 UNIT/ML injection Inject 0-12 Units into the skin 2 (two) times daily. Per sliding scale     [provider]  Pancrelipase, Lip-Prot-Amyl, (CREON) 24000-76000 units CPEP Take 2 capsules 3 times daily ( 6 tablets daily) Patient not taking: Reported on 01/11/2017 12/27/16   Zehr, Laban Emperor, PA-C     Anti-infectives (From admission, onward)   Start     Dose/Rate Route Frequency Ordered Stop   01/13/17 0745  ceFAZolin (ANCEF) IVPB 1 g/50  mL premix    Comments:  Send with pt to OR   1 g 100 mL/hr over 30 Minutes Intravenous On call 01/13/17 0734 01/13/17 1027      Results for orders placed or performed during the hospital encounter of 01/11/17 (from the past 48 hour(s))  Influenza panel by PCR (type A & B)     Status: None   Collection Time: 01/12/17 11:42 AM  Result Value Ref Range   Influenza A By PCR NEGATIVE NEGATIVE   Influenza B By PCR NEGATIVE NEGATIVE    Comment: (NOTE) The Xpert Xpress Flu assay is intended as an aid in the diagnosis of  influenza and should not be used as a sole basis for treatment.  This  assay is FDA approved for nasopharyngeal swab specimens only. Nasal  washings and aspirates are unacceptable for Xpert Xpress Flu testing.   CBG monitoring, ED     Status: Abnormal   Collection Time: 01/12/17 12:34 PM  Result Value Ref Range   Glucose-Capillary 122 (H) 65 - 99 mg/dL  CBG monitoring, ED     Status: Abnormal   Collection Time: 01/12/17  6:19 PM  Result Value Ref Range   Glucose-Capillary 107 (H) 65 - 99 mg/dL  CBG monitoring, ED     Status: Abnormal   Collection Time: 01/12/17 10:17 PM  Result Value Ref Range   Glucose-Capillary 116 (H) 65 - 99 mg/dL  Renal function panel     Status: Abnormal   Collection Time: 01/13/17  6:54 AM  Result Value Ref Range   Sodium 132 (L) 135 - 145 mmol/L   Potassium 4.8 3.5 - 5.1 mmol/L   Chloride 98 (L) 101 - 111 mmol/L   CO2 21 (L) 22 - 32 mmol/L   Glucose, Bld 99 65 - 99 mg/dL   BUN 40 (H) 6 - 20 mg/dL   Creatinine, Ser 6.47 (H) 0.61 - 1.24 mg/dL    Comment: DELTA CHECK NOTED   Calcium 8.1 (L) 8.9 - 10.3 mg/dL   Phosphorus 6.1 (H) 2.5 - 4.6 mg/dL   Albumin 2.5 (L) 3.5 - 5.0 g/dL   GFR calc non Af Amer 9 (L) >60 mL/min   GFR calc Af Amer 11 (L) >60 mL/min    Comment: (NOTE) The eGFR has been calculated using the CKD EPI equation. This calculation has not been validated in all clinical situations. eGFR's persistently <60 mL/min signify  possible Chronic Kidney Disease.    Anion gap 13 5 - 15  CBG monitoring, ED     Status: None   Collection Time: 01/13/17  7:06 AM  Result Value Ref Range   Glucose-Capillary 95 65 - 99 mg/dL  Glucose, capillary     Status: None   Collection Time: 01/13/17 11:12 AM  Result Value Ref Range   Glucose-Capillary 97 65 - 99 mg/dL  Glucose, capillary     Status: Abnormal   Collection Time: 01/13/17  5:20 PM  Result Value Ref Range   Glucose-Capillary 110 (H) 65 - 99 mg/dL  CBC     Status: Abnormal   Collection Time: 01/13/17  6:08 PM  Result Value Ref Range   WBC 10.7 (H) 4.0 - 10.5 K/uL   RBC 4.15 (L) 4.22 - 5.81 MIL/uL   Hemoglobin 11.3 (L) 13.0 - 17.0 g/dL   HCT 35.3 (L) 39.0 - 52.0 %   MCV 85.1 78.0 - 100.0 fL   MCH 27.2 26.0 - 34.0 pg   MCHC 32.0 30.0 - 36.0 g/dL   RDW 17.3 (H) 11.5 - 15.5 %   Platelets 291 150 - 400 K/uL  Creatinine, serum     Status: Abnormal   Collection Time: 01/13/17  6:08 PM  Result Value Ref Range   Creatinine, Ser 7.22 (H) 0.61 - 1.24 mg/dL   GFR calc non Af Amer 8 (L) >60 mL/min   GFR calc Af Amer 9 (L) >60 mL/min    Comment: (NOTE) The eGFR has been calculated using the CKD EPI equation. This calculation has not been validated in all clinical situations. eGFR's persistently <60 mL/min signify possible Chronic Kidney Disease.   Glucose, capillary     Status: Abnormal   Collection Time: 01/13/17  8:53 PM  Result Value Ref Range   Glucose-Capillary 111 (H) 65 - 99 mg/dL  Comprehensive metabolic panel     Status: Abnormal   Collection Time: 01/14/17  2:18 AM  Result Value Ref Range   Sodium 133 (L) 135 - 145 mmol/L   Potassium 5.8 (H) 3.5 - 5.1 mmol/L    Comment: DELTA CHECK NOTED NO VISIBLE HEMOLYSIS    Chloride 98 (L) 101 - 111 mmol/L   CO2 19 (L) 22 - 32 mmol/L   Glucose, Bld 83 65 - 99 mg/dL   BUN 50 (H) 6 - 20 mg/dL   Creatinine, Ser 7.94 (H) 0.61 - 1.24 mg/dL   Calcium 7.9 (L) 8.9 - 10.3 mg/dL   Total Protein 6.1 (L) 6.5 - 8.1 g/dL    Albumin 2.6 (L) 3.5 - 5.0 g/dL   AST 20 15 - 41 U/L   ALT 21 17 - 63 U/L   Alkaline Phosphatase 337 (H) 38 - 126 U/L   Total Bilirubin 1.7 (H) 0.3 - 1.2 mg/dL   GFR calc non Af Amer 7 (L) >60 mL/min   GFR calc Af Amer 8 (L) >60 mL/min    Comment: (NOTE) The eGFR has been calculated using the CKD EPI equation. This calculation has not been validated in all clinical situations. eGFR's persistently <60 mL/min signify possible Chronic Kidney Disease.    Anion gap 16 (H) 5 - 15  Magnesium     Status: None   Collection Time: 01/14/17  2:18 AM  Result Value Ref Range   Magnesium 1.9 1.7 - 2.4 mg/dL  CBC     Status: Abnormal   Collection Time: 01/14/17  2:18 AM  Result Value Ref Range   WBC 8.0 4.0 - 10.5 K/uL   RBC 4.09 (L) 4.22 - 5.81 MIL/uL   Hemoglobin 10.6 (L) 13.0 - 17.0 g/dL   HCT 35.2 (L) 39.0 - 52.0 %   MCV 86.1 78.0 - 100.0 fL   MCH 25.9 (L) 26.0 - 34.0 pg   MCHC 30.1 30.0 - 36.0 g/dL  RDW 17.2 (H) 11.5 - 15.5 %   Platelets 249 150 - 400 K/uL  Glucose, capillary     Status: Abnormal   Collection Time: 01/14/17  8:26 AM  Result Value Ref Range   Glucose-Capillary 104 (H) 65 - 99 mg/dL     ROS: pt not reliable to give history  And denies any problems when asked ROS.  Physical Exam: Vitals:   01/14/17 0517 01/14/17 1000  BP: 112/73 (!) 125/57  Pulse: (!) 110 (!) 105  Resp: 18 18  Temp: 98 F (36.7 C) 98.2 F (36.8 C)  SpO2: 98% 98%     General: alert  Cachectic Thin AAM, NAD Chronically ill HEENT: [place]  EOMI  Neck: supple , no jvd Heart: RRR no m, r, g Lungs: CTA , nonlabored breathing  Abdomen: Bs pos soft , NT, ND Extremities: no pedal edema  Skin: scattered healed excoriations , no overt rash  Neuro:  Soft spoken alert confused to time, ,alert to place , not sure why here, trump = president , moves extrem to request  Dialysis Access: [place] cath   Dialysis Orders: [facility]: [facility] , MonWedFri, 4 hrs 15 min, 180NRe Optiflux, BFR 400, DFR Manual 800 mL/min,  EDW 66 (kg), Dialysate 2.0 K, 2.25 Ca, . Heparin 6000  Access R IJ Perm cath (Dr. Oneida Alar apt 01/31/17 vein mapping)  Hec 5 mcg IV/HD no Mircera  , Venofer 41m q wkly    Assessment/Plan 1.  Dialysis Access   Malfunction (pt pulled out pc)- sp new perm cath insert yest.  Has Dr FEden LatheDEC . 19 vein map apt.  2. ESRD -  HD MWF schedule, due to emergent cases  Post pone hd until tomor.  3. Hypertension/volume  - bp stable  4. Anemia  - hgb 10.6 no esa / fe weekly wed hd  5. Metabolic bone disease -  hec 564m on hd , 6.1 phos , ca , not on op binder start Auryxia  6. HO anoxic Brain injury= needs pt /ot eval , may need SNHP 7. DM type 2 - per admit  8. Nutrition - renal carb mod , / alb 2.6  Protein supplement   DaErnest HaberPA-C CaMaple [phone]/26/2018, 11:11 AM   Pt seen, examined and agree w A/P as above.  RoKelly SplinterD CaNewell Rubbermaidager [phone] 01/14/2017, 3:58 PM

## 2017-01-14 NOTE — Evaluation (Signed)
Occupational Therapy Evaluation Patient Details Name: Philip West MRN: [id number] DOB: 05-12-1970 Today's Date: 01/14/2017    History of Present Illness Pt admitted from his sister's home with declining functional status, frequent falls, vomiting and diarrhea. Sister works and is not able to provide adequate care of pt. PMH: ESRD on HD x 3 months, HTN, pancreatic tumor, DM, anoxic brain injury February 2018.    Clinical Impression   Pt was dependent in sponge bathing, dressing and all IADL prior to admission. He primarily used a w/c for mobility, but his report is not consistent. Pt presents with decreased activity tolerance, generalized weakness, poor standing balance and impaired cognition, likely baseline. He is requires supervision to max assist for ADL. He stood with mod assist, but could not take any steps. Pt likely has the potential to be more independent with rehab. Will follow acutely. Recommending SNF for further therapy.  Follow Up Recommendations  SNF;Supervision/Assistance - 24 hour    Equipment Recommendations       Recommendations for Other Services       Precautions / Restrictions Precautions Precautions: Fall Precaution Comments: IJ catheter Restrictions Weight Bearing Restrictions: No      Mobility Bed Mobility Overal bed mobility: Needs Assistance Bed Mobility: Supine to Sit;Sit to Supine     Supine to sit: Min guard Sit to supine: Min assist   General bed mobility comments: heavy reliance on rail and increased pain in R hip with bed mobility, assisted LEs back into bed  Transfers Overall transfer level: Needs assistance Equipment used: Rolling walker (2 wheeled) Transfers: Sit to/from Stand Sit to Stand: Mod assist         General transfer comment: assist to rise and steady, stood x 20 seconds and then needed to sit with LE shaking, pt unable to take steps to [place] Overall balance assessment: Needs assistance   Sitting  balance-Leahy Scale: Fair       Standing balance-Leahy Scale: Poor Standing balance comment: B UE support and assist to maintain                           ADL either performed or assessed with clinical judgement   ADL Overall ADL's : Needs assistance/impaired Eating/Feeding: Set up;Bed level Eating/Feeding Details (indicate cue type and reason): increased spillage, much of meal tray on the floor Grooming: Wash/dry hands;Sitting;Supervision/safety;Set up           Upper Body Dressing : Moderate assistance;Sitting   Lower Body Dressing: Total assistance Lower Body Dressing Details (indicate cue type and reason): socks                     Vision Patient Visual Report: Blurring of vision       Perception     Praxis      Pertinent Vitals/Pain Pain Assessment: 0-10 Pain Score: 5  Pain Location: R hip Pain Descriptors / Indicators: Grimacing;Guarding;Moaning Pain Intervention(s): Monitored during session;Limited activity within patient's tolerance;Repositioned     Hand Dominance Right   Extremity/Trunk Assessment Upper Extremity Assessment Upper Extremity Assessment: Generalized weakness   Lower Extremity Assessment Lower Extremity Assessment: Defer to PT evaluation   Cervical / Trunk Assessment Cervical / Trunk Assessment: Normal   Communication Communication Communication: No difficulties   Cognition Arousal/Alertness: Awake/alert Behavior During Therapy: Flat affect Overall Cognitive Status: No family/caregiver present to determine baseline cognitive functioning  General Comments: pt with inconsistent responses from prior evaluation   General Comments       Exercises     Shoulder Instructions      Home Living Family/patient expects to be discharged to:: Unsure Living Arrangements: Other relatives(sister)                               Additional Comments: pt describes  himself as "homeless" as his sister can't take care of him      Prior Functioning/Environment Level of Independence: Needs assistance  Gait / Transfers Assistance Needed: primarily w/c level with assistance ADL's / Homemaking Assistance Needed: assist for sponge bathing, dressing, can self feed and groom   Comments: pt has a hospital bed, walker and manual w/c at home        OT Problem List: Decreased strength;Decreased activity tolerance;Impaired balance (sitting and/or standing);Decreased cognition;Decreased safety awareness;Decreased knowledge of use of DME or AE;Pain      OT Treatment/Interventions: Self-care/ADL training;DME and/or AE instruction;Therapeutic activities;Cognitive remediation/compensation;Patient/family education;Balance training    OT Goals(Current goals can be found in the care plan section) Acute Rehab OT Goals Patient Stated Goal: to get stronger  OT Goal Formulation: With patient Time For Goal Achievement: 01/21/17 Potential to Achieve Goals: Fair ADL Goals Pt Will Perform Grooming: with supervision;with set-up;sitting(3 activities) Pt Will Perform Upper Body Bathing: with min assist;sitting Pt Will Perform Upper Body Dressing: with supervision;with set-up;sitting Pt Will Transfer to Toilet: stand pivot transfer;bedside commode;with min assist  OT Frequency: Min 2X/week   Barriers to D/C: Decreased caregiver support          Co-evaluation              AM-PAC PT "6 Clicks" Daily Activity     Outcome Measure Help from another person eating meals?: A Little Help from another person taking care of personal grooming?: A Little Help from another person toileting, which includes using toliet, bedpan, or urinal?: Total Help from another person bathing (including washing, rinsing, drying)?: A Lot Help from another person to put on and taking off regular upper body clothing?: A Lot Help from another person to put on and taking off regular lower body  clothing?: Total 6 Click Score: 12   End of Session Equipment Utilized During Treatment: Gait belt;Rolling walker Nurse Communication: Mobility status(has HD shortly, asked that pt return to bed)  Activity Tolerance: Patient limited by fatigue Patient left: in bed;with call bell/phone within reach;with bed alarm set  OT Visit Diagnosis: Unsteadiness on feet (R26.81);Repeated falls (R29.6);Muscle weakness (generalized) (M62.81);History of falling (Z91.81);Other symptoms and signs involving the nervous system (R29.898);Pain Pain - Right/Left: Right Pain - part of body: Hip                Time: 1425-1447 OT Time Calculation (min): 22 min Charges:  OT General Charges $OT Visit: 1 Visit OT Evaluation $OT Eval Moderate Complexity: 1 Mod G-Codes:     02/06/17 Nestor Lewandowsky, OTR/L Pager: [phone]  Werner Lean, Haze Boyden 02-06-2017, 3:17 PM

## 2017-01-14 NOTE — Care Management Note (Signed)
Case Management Note  Patient Details  Name: Philip West MRN: [id number] Date of Birth: 31-Mar-1970  Subjective/Objective:    CM following for progression and d/c planning.                Action/Plan: 01/14/2017 CM requesting PT/OT eval to determine pt needs upon d/c. Per OT pt appropriate for short term SNF. Await PT recommendation. CSW, Manuela Schwartz working on possible SNF admission. [facility] CLIP[facility].   Expected Discharge Date:  01/13/17               Expected Discharge Plan:  [place]  In-House Referral:  Clinical Social Work  Discharge planning Services  CM Consult  Post Acute Care Choice:    Choice offered to:     DME Arranged:    DME Agency:     HH Arranged:    [place] Agency:     Status of Service:  In process, will continue to follow  If discussed at Long Length of Stay Meetings, dates discussed:    Additional Comments:  Adron Bene, RN 01/14/2017, 3:59 PM

## 2017-01-14 NOTE — Progress Notes (Signed)
PROGRESS NOTE    Philip West  JXB:[id number] DOB: 1970/05/21 DOA: 01/11/2017 PCP: Vivi Barrack, MD   Outpatient Specialists:  Lorrene Reid     Assessment & Plan:   Active Problems:   End-stage renal disease on hemodialysis ([place])   Anoxic brain injury -Patient appears at baseline although clearly has gaps in his ability to reason and memory is extremely high functioning answers questions appropriately  Frequent falls -Most likely secondary to anoxic brain injury and deconditioning -PT Eval  Essential HTN -Controlled without medication  Hypoxia -Although patient states not on home O2 when he falls asleep becomes hypoxic-- outpatient sleep study? -Continue titrate O2 to maintain SPO2>93%  Fever -Afebrile last 24 hours   ESRD onHDM,/W/F, -HD on Monday  ChronicDiarrhea -Baseline per patient   -Extremely difficult situation patient has multiple medical problems unfortunately they're all chronic. Discharge plan per social work and care management.  From [place]-- moved here to live with sister-- unable to return to her home.          DVT prophylaxis:  SQ Heparin   Code Status: Full Code   Family Communication:   Disposition Plan:     Consultants:   Renal  Social work    Subjective:   Objective: Vitals:   01/13/17 1855 01/13/17 2053 01/14/17 0517 01/14/17 1000  BP:  (!) 130/94 112/73 (!) 125/57  Pulse:  (!) 112 (!) 110 (!) 105  Resp:  17 18 18   Temp:  98.2 F (36.8 C) 98 F (36.7 C) 98.2 F (36.8 C)  TempSrc: Oral Oral Oral Oral  SpO2:  96% 98% 98%  Weight:  82.1 kg (181 lb)    Height:  6' (1.829 m)      Intake/Output Summary (Last 24 hours) at 01/14/2017 1330 Last data filed at 01/14/2017 0900 Gross per 24 hour  Intake 332 ml  Output 0 ml  Net 332 ml   Filed Weights   01/11/17 1828 01/13/17 2053  Weight: 86.6 kg (191 lb) 82.1 kg (181 lb)    Examination:  General exam: Appears calm and comfortable,  in bed Respiratory system: Clear to auscultation. Respiratory effort normal. Cardiovascular system: S1 & S2 heard, RRR. No JVD, murmurs, rubs, gallops or clicks. No pedal edema. Gastrointestinal system: Abdomen is nondistended, soft and nontender. No organomegaly or masses felt. Normal bowel sounds heard. Central nervous system: stuttering speech Extremities: Symmetric 5 x 5 power. Skin: No rashes, lesions or ulcers Psychiatry: Judgement and insight appear normal. Mood & affect appropriate.     Data Reviewed: I have personally reviewed following labs and imaging studies  CBC: Recent Labs  Lab 01/11/17 1830 01/13/17 1808 01/14/17 0218  WBC 8.0 10.7* 8.0  HGB 11.9* 11.3* 10.6*  HCT 36.7* 35.3* 35.2*  MCV 83.0 85.1 86.1  PLT 261 291 130   Basic Metabolic Panel: Recent Labs  Lab 01/11/17 1830 01/13/17 0654 01/13/17 1808 01/14/17 0218  NA 137 132*  --  133*  K 3.4* 4.8  --  5.8*  CL 100* 98*  --  98*  CO2 22 21*  --  19*  GLUCOSE 106* 99  --  83  BUN 19 40*  --  50*  CREATININE 3.88* 6.47* 7.22* 7.94*  CALCIUM 7.7* 8.1*  --  7.9*  MG  --   --   --  1.9  PHOS  --  6.1*  --   --    GFR: Estimated Creatinine Clearance: 12.8 mL/min (A) (by C-G formula based on SCr  of 7.94 mg/dL (H)). Liver Function Tests: Recent Labs  Lab 01/11/17 1830 01/13/17 0654 01/14/17 0218  AST 41  --  20  ALT 44  --  21  ALKPHOS 568*  --  337*  BILITOT 1.2  --  1.7*  PROT 6.9  --  6.1*  ALBUMIN 2.9* 2.5* 2.6*   Recent Labs  Lab 01/11/17 1830  LIPASE 18   No results for input(s): AMMONIA in the last 168 hours. Coagulation Profile: No results for input(s): INR, PROTIME in the last 168 hours. Cardiac Enzymes: No results for input(s): CKTOTAL, CKMB, CKMBINDEX, TROPONINI in the last 168 hours. BNP (last 3 results) No results for input(s): PROBNP in the last 8760 hours. HbA1C: No results for input(s): HGBA1C in the last 72 hours. CBG: Recent Labs  Lab 01/13/17 1112 01/13/17 1720  01/13/17 2053 01/14/17 0826 01/14/17 1228  GLUCAP 97 110* 111* 104* 110*   Lipid Profile: No results for input(s): CHOL, HDL, LDLCALC, TRIG, CHOLHDL, LDLDIRECT in the last 72 hours. Thyroid Function Tests: No results for input(s): TSH, T4TOTAL, FREET4, T3FREE, THYROIDAB in the last 72 hours. Anemia Panel: No results for input(s): VITAMINB12, FOLATE, FERRITIN, TIBC, IRON, RETICCTPCT in the last 72 hours. Urine analysis: No results found for: COLORURINE, APPEARANCEUR, LABSPEC, [place], GLUCOSEU, HGBUR, BILIRUBINUR, KETONESUR, PROTEINUR, UROBILINOGEN, NITRITE, LEUKOCYTESUR   )No results found for this or any previous visit (from the past 240 hour(s)).    Anti-infectives (From admission, onward)   Start     Dose/Rate Route Frequency Ordered Stop   01/13/17 0745  ceFAZolin (ANCEF) IVPB 1 g/50 mL premix    Comments:  Send with pt to OR   1 g 100 mL/hr over 30 Minutes Intravenous On call 01/13/17 0734 01/13/17 1027       Radiology Studies: Dg Chest Port 1 View  Result Date: 01/13/2017 CLINICAL DATA:  46 year old male status post dialysis catheter insertion. EXAM: PORTABLE CHEST 1 VIEW COMPARISON:  01/12/2017 FINDINGS: Right-sided dialysis catheter terminates in the right atrium. There is stable cardiomegaly and pulmonary vascular congestion. No focal parenchymal consolidation, sizable effusion or pneumothorax. No acute osseous abnormalities. IMPRESSION: Right-sided dialysis catheter terminating in the right atrium. Otherwise unchanged radiographic examination of the chest. Electronically Signed   By: Kristopher Oppenheim M.D.   On: 01/13/2017 11:28   Dg Fluoro Guide Cv Line-no Report  Result Date: 01/13/2017 Fluoroscopy was utilized by the requesting physician.  No radiographic interpretation.        Scheduled Meds: . aspirin EC  81 mg Oral Daily  . cholestyramine  4 g Oral BID PC  . [START ON 09/47/0962] Folic Acid-Vit E3-MOQ H47  1 tablet Oral Daily  . heparin  5,000 Units  Subcutaneous Q8H  . hydrocerin   Topical BID  . hydrOXYzine  10 mg Oral TID  . insulin aspart  0-9 Units Subcutaneous TID WC  . insulin detemir  15 Units Subcutaneous Q0600   And  . insulin detemir  10 Units Subcutaneous QPM  . lipase/protease/amylase  24,000 Units Oral TID WC  . niacin  500 mg Oral QHS  . [START ON 01/15/2017] pantoprazole  40 mg Oral Daily   Continuous Infusions:   LOS: 0 days    Time spent: 35 min    Geradine Girt, DO [facility] Pager [phone]  If 7PM-7AM, please contact night-coverage [email or web address] Password [email or web address] 01/14/2017, 1:30 PM

## 2017-01-14 NOTE — Progress Notes (Signed)
   01/14/17 1500  OT G-codes **NOT FOR INPATIENT CLASS**  Functional Assessment Tool Used Clinical judgement  Functional Limitation Self care  Self Care Current Status [phone]) CL  Self Care Goal Status (P3790) CJ  01/14/2017 Nestor Lewandowsky, OTR/L Pager: [phone]

## 2017-01-14 NOTE — Evaluation (Signed)
Physical Therapy Evaluation Patient Details Name: Philip West MRN: [id number] DOB: 1970/02/22 Today's Date: 01/14/2017   History of Present Illness  46 y.o. male admittedon 01/11/17 from his sister's home with declining functional status, frequent falls, vomiting and diarrhea. Sister works and is not able to provide adequate care of pt. PMH: ESRD on HD x 3 months, HTN, pancreatic tumor, DM, anoxic brain injury February 2018.   Clinical Impression  Pt is generally weak with h/o recent fall, and  His sister can no longer care for him at home.  He was unable and unwilling to attempt gait with RW around his room.  He would likely be safer with chair to follow at attempts at gait in future sessions.  He was unable to stand for >30 seconds with RW support.  He has muscle wasting and atrophy as well as hundreds of scabs and scars from incessant scratching.   PT to follow acutely for deficits listed below.       Follow Up Recommendations SNF    Equipment Recommendations  None recommended by PT    Recommendations for Other Services    NA    Precautions / Restrictions Precautions Precautions: Fall Precaution Comments: IJ catheter Restrictions Weight Bearing Restrictions: No      Mobility  Bed Mobility Overal bed mobility: Needs Assistance Bed Mobility: Supine to Sit;Sit to Supine     Supine to sit: Min assist;HOB elevated Sit to supine: Mod assist   General bed mobility comments: Min hand held assist to pull up to sitting EOB.  Using bed rail and HOB ~30 degrees.  Pt needed assist to lift both legs back into bed when returning to supine in a flat bed.   Transfers Overall transfer level: Needs assistance Equipment used: Rolling walker (2 wheeled) Transfers: Sit to/from Stand Sit to Stand: Min assist         General transfer comment: Min assist to support trunk during transitions.    Ambulation/Gait             General Gait Details: Refued to attempt gait to the  door.          Balance Overall balance assessment: Needs assistance Sitting-balance support: Feet supported;No upper extremity supported Sitting balance-Leahy Scale: Fair     Standing balance support: Bilateral upper extremity supported Standing balance-Leahy Scale: Poor Standing balance comment: Pt only able to stand <30 seconds.  Reporting pain and weakness as his reason he could not stand longer.                               Pertinent Vitals/Pain Pain Assessment: Faces Pain Score: 5  Faces Pain Scale: Hurts even more Pain Location: R hip Pain Descriptors / Indicators: Grimacing;Guarding;Moaning Pain Intervention(s): Limited activity within patient's tolerance;Monitored during session;Repositioned    Home Living Family/patient expects to be discharged to:: Private residence Living Arrangements: Other relatives(sister) Available Help at Discharge: Family;Available PRN/intermittently Type of Home: Apartment Home Access: Stairs to enter Entrance Stairs-Rails: Right;Left Entrance Stairs-Number of Steps: 2 flights Home Layout: One level Home Equipment: Environmental consultant - 2 wheels;Wheelchair - manual;Hospital bed(transport chair) Additional Comments: Pt reporting sister can no longer provide care for him. He moved here from [place] and reports he did have a fiancee there that could care for him, but he moved here so his sister could and now she is saying she cannot.      Prior Function Level of Independence: Needs assistance  Gait / Transfers Assistance Needed: Per pt report he was using a RW for short distance gait, transport chair to get up and down the stairs for HD (sister was bumping him up and down with a second person).    ADL's / Homemaking Assistance Needed: assist for sponge bathing, dressing, can self feed and groom  Comments: Pt reports he was active with [facility] within the last month and that they were there for 3 weeks and stopped ~ 3 weeks ago.  He said they  had him go up and down the stairs "once".      Hand Dominance   Dominant Hand: Right    Extremity/Trunk Assessment   Upper Extremity Assessment Upper Extremity Assessment: Defer to OT evaluation    Lower Extremity Assessment Lower Extremity Assessment: Generalized weakness(3-/5 to 3/5, multiple bil LE open scabs)    Cervical / Trunk Assessment Cervical / Trunk Assessment: Normal  Communication   Communication: No difficulties  Cognition Arousal/Alertness: Awake/alert Behavior During Therapy: Flat affect Overall Cognitive Status: No family/caregiver present to determine baseline cognitive functioning                                 General Comments: Pt reporting different things than OT eval.              Assessment/Plan    PT Assessment Patient needs continued PT services  PT Problem List Decreased strength;Decreased activity tolerance;Decreased balance;Decreased mobility;Decreased knowledge of use of DME;Pain       PT Treatment Interventions DME instruction;Stair training;Gait training;Therapeutic activities;Functional mobility training;Therapeutic exercise;Balance training;Patient/family education    PT Goals (Current goals can be found in the Care Plan section)  Acute Rehab PT Goals Patient Stated Goal: to get stronger  PT Goal Formulation: With patient Time For Goal Achievement: 01/28/17 Potential to Achieve Goals: Good    Frequency Min 2X/week(would benefit from daily therapy at SNF)   Barriers to discharge Decreased caregiver support;Inaccessible home environment Pt has to go up multiple flights of stairs to get to his sister's apartment, sister works and is not there to assist him physically all of the time like he needs.        AM-PAC PT "6 Clicks" Daily Activity  Outcome Measure Difficulty turning over in bed (including adjusting bedclothes, sheets and blankets)?: A Little Difficulty moving from lying on back to sitting on the side of  the bed? : Unable Difficulty sitting down on and standing up from a chair with arms (e.g., wheelchair, bedside commode, etc,.)?: Unable Help needed moving to and from a bed to chair (including a wheelchair)?: A Little Help needed walking in hospital room?: A Little Help needed climbing 3-5 steps with a railing? : A Lot 6 Click Score: 13    End of Session   Activity Tolerance: Patient limited by fatigue;Patient limited by pain Patient left: in bed;with call bell/phone within reach;with bed alarm set Nurse Communication: Mobility status PT Visit Diagnosis: Muscle weakness (generalized) (M62.81);Difficulty in walking, not elsewhere classified (R26.2)    Time: 4193-7902 PT Time Calculation (min) (ACUTE ONLY): 21 min   Charges:   PT Evaluation $PT Eval Moderate Complexity: 1 Mod     PT G Codes:   PT G-Codes **NOT FOR INPATIENT CLASS** Functional Assessment Tool Used: AM-PAC 6 Clicks Basic Mobility Functional Limitation: Mobility: Walking and moving around Mobility: Walking and Moving Around Current Status (I0973): At least 40 percent but less than 60  percent impaired, limited or restricted Mobility: Walking and Moving Around Goal Status [phone]): At least 1 percent but less than 20 percent impaired, limited or restricted   Chanson Teems B. [place], [place], DPT [phone]   01/14/2017, 5:12 PM

## 2017-01-14 NOTE — NC FL2 (Signed)
[place] LEVEL OF CARE SCREENING TOOL     IDENTIFICATION  Patient Name: Philip West Birthdate: 08-Dec-1970 Sex: male Admission Date (Current Location): 01/11/2017  [place] and [place] Number:  [place] and Address:  [facility], [place] [address], [place], [place] [address]      Provider Number: [id number]  Attending Physician Name and Address:  Geradine Girt, DO  Relative Name and Phone Number:  POA- Wyonia Hough, [phone]    Current Level of Care: Hospital Recommended Level of Care: [place] Prior Approval Number:    Date Approved/Denied:   PASRR Number: [id number]  Discharge Plan: SNF    Current Diagnoses: Patient Active Problem List   Diagnosis Date Noted  . End-stage renal disease on hemodialysis ([place]) 01/13/2017  . 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 Diarrhea 12/27/2016  . ESRD (end stage renal disease) on dialysis ([place]) 12/11/2016  . [place] ([place]) 12/11/2016  . Anoxic brain injury ([place]) 12/11/2016  . Diabetes mellitus with ESRD (end-stage renal disease) ([place])   . Hyperlipidemia associated with type 2 diabetes mellitus ([place])   . Hypertension associated with diabetes ([place])     Orientation RESPIRATION BLADDER Height & Weight     Self, Place  Normal Continent Weight: 181 lb (82.1 kg) Height:  6' (182.9 cm)  BEHAVIORAL SYMPTOMS/MOOD NEUROLOGICAL BOWEL NUTRITION STATUS      Incontinent Diet(renal/carb modified)  AMBULATORY STATUS COMMUNICATION OF NEEDS Skin   Extensive Assist Verbally Surgical wounds(closed incision R neck, liquid skin adhesive; open incision L knee, foam dressing)                       Personal Care Assistance Level of Assistance  Bathing, Feeding, Dressing Bathing Assistance: Maximum assistance Feeding assistance: Limited assistance Dressing Assistance: Maximum assistance     Functional Limitations Info  Sight, Hearing, Speech Sight Info:  Adequate Hearing Info: Adequate Speech Info: Adequate    SPECIAL CARE FACTORS FREQUENCY  PT (By licensed PT), OT (By licensed OT)     PT Frequency: 5x/week OT Frequency: 5x/week            Contractures Contractures Info: Not present    Additional Factors Info  Code Status, Allergies, Insulin Sliding Scale Code Status Info: Full Allergies Info: No Known Allergies   Insulin Sliding Scale Info: insulin novolog 3x/day with meals; insulin levemir daily       Current Medications (01/14/2017):  This is the current hospital active medication list Current Facility-Administered Medications  Medication Dose Route Frequency Provider Last Rate Last Dose  . acetaminophen (TYLENOL) tablet 650 mg  650 mg Oral Q6H PRN Allie Bossier, MD       Or  . acetaminophen (TYLENOL) suppository 650 mg  650 mg Rectal Q6H PRN Allie Bossier, MD      . aspirin EC tablet 81 mg  81 mg Oral Daily Eulogio Bear U, DO   81 mg at 01/14/17 1307  . camphor-menthol (SARNA) lotion   Topical PRN Eulogio Bear U, DO      . cholestyramine Lucrezia Starch) packet 4 g  4 g Oral BID PC Virgel Manifold, MD   4 g at 01/14/17 0834  . diphenhydrAMINE (BENADRYL) capsule 25 mg  25 mg Oral Q6H PRN Lovey Newcomer T, NP   25 mg at 01/14/17 0203  . [START ON 01/16/2017] doxercalciferol (HECTOROL) injection 5 mcg  5 mcg Intravenous Q M,W,F-HD Ernest Haber, PA-C      .  ferric citrate (AURYXIA) tablet 420 mg  420 mg Oral TID WC Ernest Haber, PA-C      . [START ON 01/16/2017] ferric gluconate (NULECIT) 62.5 mg in sodium chloride 0.9 % 100 mL IVPB  62.5 mg Intravenous Q Wed-HD Ernest Haber, PA-C      . [START ON 81/15/7262] Folic Acid-Vit M3-TDH R41 (FOLBEE) tablet 1 tablet  1 tablet Oral Daily Vann, Jessica U, DO      . heparin injection 5,000 Units  5,000 Units Subcutaneous Q8H Allie Bossier, MD      . hydrocerin (EUCERIN) cream   Topical BID Allie Bossier, MD      . hydrOXYzine (ATARAX/VISTARIL) tablet 10 mg  10 mg Oral TID  Vann, Jessica U, DO      . insulin aspart (novoLOG) injection 0-9 Units  0-9 Units Subcutaneous TID WC Orpah Greek, MD   Stopped at 01/13/17 [phone]  . insulin detemir (LEVEMIR) injection 15 Units  15 Units Subcutaneous Q0600 Orpah Greek, MD   15 Units at 01/14/17 0827   And  . insulin detemir (LEVEMIR) injection 10 Units  10 Units Subcutaneous QPM Orpah Greek, MD   10 Units at 01/13/17 1755  . lipase/protease/amylase (CREON) capsule 24,000 Units  24,000 Units Oral TID WC Virgel Manifold, MD   24,000 Units at 01/14/17 1308  . loperamide (IMODIUM) capsule 2 mg  2 mg Oral QID PRN Virgel Manifold, MD   2 mg at 01/14/17 5364  . niacin tablet 500 mg  500 mg Oral QHS Virgel Manifold, MD   500 mg at 01/13/17 2257  . oxyCODONE-acetaminophen (PERCOCET/ROXICET) 5-325 MG per tablet 1-2 tablet  1-2 tablet Oral Q4H PRN Angelia Mould, MD      . Derrill Memo ON 01/15/2017] pantoprazole (PROTONIX) EC tablet 40 mg  40 mg Oral Daily Vann, Jessica U, DO      . traMADol Veatrice Bourbon) tablet 50 mg  50 mg Oral Q12H PRN Allie Bossier, MD         Discharge Medications: Please see discharge summary for a list of discharge medications.  Relevant Imaging Results:  Relevant Lab Results:   Additional Information SSN: [id number]  Estanislado Emms, LCSW

## 2017-01-14 NOTE — Progress Notes (Signed)
CSW following for disposition planning; faxed out initial SNF referrals. CSW left voicemail message for patient's POA, Carlyon Shadow, requesting to discuss plan. Also awaiting PT recommendations. CSW to follow.  Philip West, [place]

## 2017-01-15 DIAGNOSIS — E871 Hypo-osmolality and hyponatremia: Secondary | ICD-10-CM

## 2017-01-15 DIAGNOSIS — E875 Hyperkalemia: Secondary | ICD-10-CM

## 2017-01-15 LAB — GLUCOSE, CAPILLARY
GLUCOSE-CAPILLARY: 34 mg/dL — AB (ref 65–99)
GLUCOSE-CAPILLARY: 45 mg/dL — AB (ref 65–99)
GLUCOSE-CAPILLARY: 97 mg/dL (ref 65–99)
GLUCOSE-CAPILLARY: 134 mg/dL — AB (ref 65–99)
Glucose-Capillary: 35 mg/dL — CL (ref 65–99)
Glucose-Capillary: 70 mg/dL (ref 65–99)
Glucose-Capillary: 59 mg/dL — ABNORMAL LOW (ref 65–99)
Glucose-Capillary: 76 mg/dL (ref 65–99)

## 2017-01-15 LAB — CBC
HEMATOCRIT: 33.4 % — AB (ref 39.0–52.0)
Hemoglobin: 10.5 g/dL — ABNORMAL LOW (ref 13.0–17.0)
MCH: 26.1 pg (ref 26.0–34.0)
MCHC: 31.4 g/dL (ref 30.0–36.0)
MCV: 82.9 fL (ref 78.0–100.0)
Platelets: 264 10*3/uL (ref 150–400)
RBC: 4.03 MIL/uL — ABNORMAL LOW (ref 4.22–5.81)
RDW: 16.8 % — AB (ref 11.5–15.5)
WBC: 8.8 10*3/uL (ref 4.0–10.5)

## 2017-01-15 LAB — RENAL FUNCTION PANEL
ALBUMIN: 2.4 g/dL — AB (ref 3.5–5.0)
Anion gap: 16 — ABNORMAL HIGH (ref 5–15)
BUN: 74 mg/dL — AB (ref 6–20)
CO2: 18 mmol/L — AB (ref 22–32)
Calcium: 7.6 mg/dL — ABNORMAL LOW (ref 8.9–10.3)
Chloride: 97 mmol/L — ABNORMAL LOW (ref 101–111)
Creatinine, Ser: 10.04 mg/dL — ABNORMAL HIGH (ref 0.61–1.24)
GFR calc Af Amer: 6 mL/min — ABNORMAL LOW (ref >60)
GFR calc non Af Amer: 5 mL/min — ABNORMAL LOW (ref >60)
GLUCOSE: 77 mg/dL (ref 65–99)
PHOSPHORUS: 8.7 mg/dL — AB (ref 2.5–4.6)
POTASSIUM: 5.3 mmol/L — AB (ref 3.5–5.1)
Sodium: 131 mmol/L — ABNORMAL LOW (ref 135–145)

## 2017-01-15 LAB — MRSA PCR SCREENING: MRSA BY PCR: POSITIVE — AB

## 2017-01-15 LAB — HEPATITIS B SURFACE ANTIGEN: Hepatitis B Surface Ag: NEGATIVE

## 2017-01-15 MED ORDER — PENTAFLUOROPROP-TETRAFLUOROETH EX AERO: 1.0000 "application " | INHALATION_SPRAY | CUTANEOUS | Status: DC | PRN

## 2017-01-15 MED ORDER — CHLORHEXIDINE GLUCONATE CLOTH 2 % EX PADS
6.0000 | MEDICATED_PAD | Freq: Every day | CUTANEOUS | Status: DC
  Administered 2017-01-15 – 2017-01-18 (×4): 6 via TOPICAL

## 2017-01-15 MED ORDER — ALTEPLASE 2 MG IJ SOLR: 2.0000 mg | Freq: Once | INTRAMUSCULAR | Status: DC | PRN

## 2017-01-15 MED ORDER — SODIUM CHLORIDE 0.9 % IV SOLN: 100.0000 mL | INTRAVENOUS | Status: DC | PRN

## 2017-01-15 MED ORDER — GLUCOSE 40 % PO GEL
ORAL | Status: AC
  Administered 2017-01-15: 37.5 g
  Filled 2017-01-15: qty 1

## 2017-01-15 MED ORDER — LIDOCAINE-PRILOCAINE 2.5-2.5 % EX CREA: 1.0000 "application " | TOPICAL_CREAM | CUTANEOUS | Status: DC | PRN

## 2017-01-15 MED ORDER — GLUCOSE 40 % PO GEL: 1.0000 | Freq: Once | ORAL | Status: AC

## 2017-01-15 MED ORDER — DEXTROSE 50 % IV SOLN
INTRAVENOUS | Status: AC
  Filled 2017-01-15: qty 50

## 2017-01-15 MED ORDER — MUPIROCIN 2 % EX OINT
1.0000 "application " | TOPICAL_OINTMENT | Freq: Two times a day (BID) | CUTANEOUS | Status: DC
  Administered 2017-01-15 – 2017-01-18 (×8): 1 via NASAL
  Filled 2017-01-15: qty 22

## 2017-01-15 MED ORDER — LIDOCAINE HCL (PF) 1 % IJ SOLN: 5.0000 mL | INTRAMUSCULAR | Status: DC | PRN

## 2017-01-15 MED ORDER — HEPARIN SODIUM (PORCINE) 1000 UNIT/ML IJ SOLN
1600.0000 [IU] | Freq: Once | INTRAMUSCULAR | Status: AC
  Administered 2017-01-15: 1600 [IU] via INTRAVENOUS

## 2017-01-15 MED ORDER — HEPARIN SODIUM (PORCINE) 1000 UNIT/ML DIALYSIS
1000.0000 [IU] | INTRAMUSCULAR | Status: DC | PRN
Start: 2017-01-15 — End: 2017-01-18

## 2017-01-15 MED ORDER — TRAMADOL HCL 50 MG PO TABS
ORAL_TABLET | ORAL | Status: AC
  Administered 2017-01-15: 50 mg via ORAL
  Filled 2017-01-15: qty 1

## 2017-01-15 MED ORDER — HEPARIN SODIUM (PORCINE) 1000 UNIT/ML DIALYSIS
5000.0000 [IU] | Freq: Once | INTRAMUSCULAR | Status: DC
  Filled 2017-01-15: qty 5

## 2017-01-15 MED ORDER — GLUCOSE 40 % PO GEL
ORAL | Status: AC
  Administered 2017-01-15: 08:00:00
  Filled 2017-01-15: qty 1

## 2017-01-15 NOTE — Progress Notes (Signed)
PROGRESS NOTE    Philip West  UYQ:[id number] DOB: [birth year] DOA: 01/11/2017 PCP: Vivi Barrack, MD   Outpatient Specialists:  Lorrene Reid   Assessment & Plan:   Active Problems:   End-stage renal disease on hemodialysis ([place])   Anoxic brain injury -Patient appears at baseline although clearly has gaps in his ability to reason and memory is extremely high functioning answers questions appropriately  Frequent falls -Most likely secondary to anoxic brain injury and deconditioning -PT Eval-- SNF pending  Essential HTN -Controlled without medication  Hypoxia -Although patient states not on home O2 when he falls asleep becomes hypoxic-- outpatient sleep study? -Continue titrate O2 to maintain SPO2>93%  Fever -Afebrile last 24 hours   ESRD onHDM,/W/F, -HD on Monday -getting again on 11/27 and plan again in the AM  ChronicDiarrhea -Baseline per patient   -Extremely difficult situation patient has multiple medical problems unfortunately they're all chronic. Discharge plan per social work and care management.  From [place]-- moved here to live with sister-- unable to return to her home.          DVT prophylaxis:  SQ Heparin   Code Status: Full Code   Family Communication: None available  Disposition Plan:  SNF pending   Consultants:   Renal  Social work    Subjective: No current complaints  Objective: Vitals:   01/15/17 1045 01/15/17 1115 01/15/17 1145 01/15/17 1215  BP: (!) 148/110 (!) 145/107 126/79 (!) 151/88  Pulse: 94 96 94 96  Resp:      Temp:      TempSrc:      SpO2:      Weight:      Height:        Intake/Output Summary (Last 24 hours) at 01/15/2017 1343 Last data filed at 01/15/2017 0900 Gross per 24 hour  Intake 600 ml  Output 0 ml  Net 600 ml   Filed Weights   01/11/17 1828 01/13/17 2053 01/14/17 2051  Weight: 86.6 kg (191 lb) 82.1 kg (181 lb) 82.1 kg (181 lb)    Examination:  General exam:  awake, NAD Respiratory system: clear, no increased work of breathing Cardiovascular system: rrr Gastrointestinal system: +BS, soft Central nervous system: speech stuttering     Data Reviewed: I have personally reviewed following labs and imaging studies  CBC: Recent Labs  Lab 01/11/17 1830 01/13/17 1808 01/14/17 0218 01/15/17 0955  WBC 8.0 10.7* 8.0 8.8  HGB 11.9* 11.3* 10.6* 10.5*  HCT 36.7* 35.3* 35.2* 33.4*  MCV 83.0 85.1 86.1 82.9  PLT 261 291 249 638   Basic Metabolic Panel: Recent Labs  Lab 01/11/17 1830 01/13/17 0654 01/13/17 1808 01/14/17 0218 01/15/17 0957  NA 137 132*  --  133* 131*  K 3.4* 4.8  --  5.8* 5.3*  CL 100* 98*  --  98* 97*  CO2 22 21*  --  19* 18*  GLUCOSE 106* 99  --  83 77  BUN 19 40*  --  50* 74*  CREATININE 3.88* 6.47* 7.22* 7.94* 10.04*  CALCIUM 7.7* 8.1*  --  7.9* 7.6*  MG  --   --   --  1.9  --   PHOS  --  6.1*  --   --  8.7*   GFR: Estimated Creatinine Clearance: 10.1 mL/min (A) (by C-G formula based on SCr of 10.04 mg/dL (H)). Liver Function Tests: Recent Labs  Lab 01/11/17 1830 01/13/17 0654 01/14/17 0218 01/15/17 0957  AST 41  --  20  --  ALT 44  --  21  --   ALKPHOS 568*  --  337*  --   BILITOT 1.2  --  1.7*  --   PROT 6.9  --  6.1*  --   ALBUMIN 2.9* 2.5* 2.6* 2.4*   Recent Labs  Lab 01/11/17 1830  LIPASE 18   No results for input(s): AMMONIA in the last 168 hours. Coagulation Profile: No results for input(s): INR, PROTIME in the last 168 hours. Cardiac Enzymes: No results for input(s): CKTOTAL, CKMB, CKMBINDEX, TROPONINI in the last 168 hours. BNP (last 3 results) No results for input(s): PROBNP in the last 8760 hours. HbA1C: No results for input(s): HGBA1C in the last 72 hours. CBG: Recent Labs  Lab 01/14/17 1710 01/14/17 2046 01/15/17 0738 01/15/17 0757 01/15/17 0829  GLUCAP 89 96 35* 34* 70   Lipid Profile: No results for input(s): CHOL, HDL, LDLCALC, TRIG, CHOLHDL, LDLDIRECT in the last 72  hours. Thyroid Function Tests: No results for input(s): TSH, T4TOTAL, FREET4, T3FREE, THYROIDAB in the last 72 hours. Anemia Panel: No results for input(s): VITAMINB12, FOLATE, FERRITIN, TIBC, IRON, RETICCTPCT in the last 72 hours. Urine analysis: No results found for: COLORURINE, APPEARANCEUR, [place], [place], [place], [place], [place], [place], [place], [place], NITRITE, LEUKOCYTESUR   ) Recent Results (from the past 240 hour(s))  MRSA PCR Screening     Status: Abnormal   Collection Time: 01/14/17 11:34 PM  Result Value Ref Range Status   MRSA by PCR POSITIVE (A) NEGATIVE Final    Comment:        The GeneXpert MRSA Assay (FDA approved for NASAL specimens only), is one component of a comprehensive MRSA colonization surveillance program. It is not intended to diagnose MRSA infection nor to guide or monitor treatment for MRSA infections. RESULT CALLED TO, READ BACK BY AND VERIFIED WITH: E CASTRO RN 0330 01/15/17 A BROWNING       Anti-infectives (From admission, onward)   Start     Dose/Rate Route Frequency Ordered Stop   01/13/17 0745  ceFAZolin (ANCEF) IVPB 1 g/50 mL premix    Comments:  Send with pt to OR   1 g 100 mL/hr over 30 Minutes Intravenous On call 01/13/17 0734 01/13/17 1027       Radiology Studies: No results found.      Scheduled Meds: . aspirin EC  81 mg Oral Daily  . Chlorhexidine Gluconate Cloth  6 each Topical Q0600  . cholestyramine  4 g Oral BID PC  . [START ON 01/16/2017] doxercalciferol  5 mcg Intravenous Q M,W,F-HD  . ferric citrate  420 mg Oral TID WC  . Folic Acid-Vit R0-QTM A26  1 tablet Oral Daily  . heparin  5,000 Units Subcutaneous Q8H  . hydrocerin   Topical BID  . hydrOXYzine  10 mg Oral TID  . insulin aspart  0-9 Units Subcutaneous TID WC  . insulin detemir  15 Units Subcutaneous Q0600   And  . insulin detemir  10 Units Subcutaneous QPM  . lipase/protease/amylase  24,000 Units Oral TID WC  . mupirocin ointment   1 application Nasal BID  . niacin  500 mg Oral QHS  . pantoprazole  40 mg Oral Daily   Continuous Infusions: . [START ON 01/16/2017] ferric gluconate (FERRLECIT/NULECIT) IV       LOS: 1 day    Time spent: 25 min    Geradine Girt, DO [facility] Pager [phone]  If 7PM-7AM, please contact night-coverage [email or web address] Password [email or web address] 01/15/2017, 1:43 PM

## 2017-01-15 NOTE — Clinical Social Work Note (Signed)
Clinical Social Work Assessment  Patient Details  Name: Philip West MRN: [id number] Date of Birth: [birth year]  Date of referral:  01/12/17               Reason for consult:  Facility Placement                Permission sought to share information with:  Family Supports Permission granted to share information::  Yes, Verbal Permission Granted  Name::     Philip West - [phone]; Philip West - [facility])  Agency::     Relationship::  Sister and Philip West Information:  Sister - [phone] and Friend - [phone]  Housing/Transportation Living arrangements for the past 2 months:  Apartment Source of Information:  Other (Comment Required), Friend/Neighbor(Sister, friend and patient) Patient Interpreter Needed:  None Criminal Activity/Legal Involvement Pertinent to Current Situation/Hospitalization:  No - Comment as needed Significant Relationships:  Siblings, Friend Lives with:  Siblings(Patient was living with sister when admitted to hospital) Do you feel safe going back to the place where you live?  No(Patient's sister reported that she can no longer care for patient and brought him to hospital for placement.) Need for family participation in patient care:  Yes (Comment)  Care giving concerns:  Sister reported that she can no longer care for patient and that skilled nursing care needed.   Social Worker assessment / plan:  CSW talked with patient on 11/27 at the bedside. Patient was awake, alert and agreeable to talking with CSW regarding his discharge plan. Patient advised of SNF recommendation and was provided with facility responses. Philip West expressed agreement with the plan and requested that CSW talk with his POA and also his sister Philip West to assist with selecting a facility.    Employment status:  Disabled (Comment on whether or not currently receiving Disability) Insurance information:  Medicare, Managed Care PT Recommendations:  [place] / Referral to community resources:  Skilled Nursing Facility(Patient provided with SNF list with facility responses)  Patient/Family's Response to care:  Philip West did not express any concerns regarding his care during hospitalization.,  Patient/Family's Understanding of and Emotional Response to Diagnosis, Current Treatment, and Prognosis:  Philip West expressed complete agreement with ST rehab and gave CSW permission to talk with his sister regarding choosing the facility.   Emotional Assessment Appearance:  Appears stated age Attitude/Demeanor/Rapport:  Other(Appropriate) Affect (typically observed):  Appropriate Orientation:  Oriented to Self, Oriented to Place, Oriented to  Time, Oriented to Situation Alcohol / Substance use:  Never Used Psych involvement (Current and /or in the community):  No (Comment)  Discharge Needs  Concerns to be addressed:  Discharge Planning Concerns Readmission within the last 30 days:  No Current discharge risk:  None Barriers to Discharge:  No Barriers Identified   Philip Feil, LCSW 01/15/2017, 4:10 PM

## 2017-01-15 NOTE — Progress Notes (Addendum)
Patient came back from HD with CBG of 45, gave Glucose gel, paged Dr. Eliseo Squires, gave order to hold the Levemir.  Checked the CBG again it was at 50, paged Dr. Eliseo Squires gave order to repeat glucose gel, gave to the patient and checked again, came to 76.  Will continue monitor closely.

## 2017-01-15 NOTE — Progress Notes (Signed)
[facility] Progress Note  Subjective: no c/o  Vitals:   01/15/17 0943 01/15/17 1015 01/15/17 1045 01/15/17 1115  BP: (!) 131/99 (!) 143/67 (!) 148/110 (!) 145/107  Pulse: 92 85 94 96  Resp:      Temp:      TempSrc:      SpO2:      Weight:      Height:        Inpatient medications: . aspirin EC  81 mg Oral Daily  . Chlorhexidine Gluconate Cloth  6 each Topical Q0600  . cholestyramine  4 g Oral BID PC  . [START ON 01/16/2017] doxercalciferol  5 mcg Intravenous Q M,W,F-HD  . ferric citrate  420 mg Oral TID WC  . Folic Acid-Vit I1-WER X54  1 tablet Oral Daily  . heparin  5,000 Units Subcutaneous Q8H  . hydrocerin   Topical BID  . hydrOXYzine  10 mg Oral TID  . insulin aspart  0-9 Units Subcutaneous TID WC  . insulin detemir  15 Units Subcutaneous Q0600   And  . insulin detemir  10 Units Subcutaneous QPM  . lipase/protease/amylase  24,000 Units Oral TID WC  . mupirocin ointment  1 application Nasal BID  . niacin  500 mg Oral QHS  . pantoprazole  40 mg Oral Daily   . [START ON 01/16/2017] ferric gluconate (FERRLECIT/NULECIT) IV     acetaminophen **OR** acetaminophen, camphor-menthol, diphenhydrAMINE, loperamide, oxyCODONE-acetaminophen, traMADol  Exam: Alert, no distress No jvd Chest cta bilat RRR no mrg Abd soft ntnd no ascites Ext no edema Nonfocal neuro R IJ perm cath  Dialysis:  [facility] MWF 4h 23min    66kg  2/2.25   Hep 6000  RIJ cath (Dr. Oneida Alar apt 01/31/17 vein mapping) Hec 5 mcg IV/HD no Mircera  , Venofer 50mg  q wkly        Impression: 1. Dialysis Access   Malfunction (pt pulled out pc)- sp new perm cath insert 11/25.  Has appt with [facility] (Dr Eden Lathe) on 12/19 for vein mapping. 2. ESRD -  HD MWF. HD today off sched.  3. Hypertension/volume  - bp stable  4. Anemia  - hgb 10.6 no esa / fe weekly wed hd  5. Metabolic bone disease -  hec 18mcg on hd , 6.1 phos , ca , not on op binder start Auryxia  6. HO anoxic Brain injury= needs pt /ot eval , may  need SNHP 7. DM type 2 - per admit  8. Nutrition - renal carb mod , / alb 2.6  Protein supplement  9. Dispo - looks like may need SNF placement   Plan - HD today and then tomorrow   Kelly Splinter MD [place] pager [phone]   01/15/2017, 11:53 AM   Recent Labs  Lab 01/13/17 0654 01/13/17 1808 01/14/17 0218 01/15/17 0957  NA 132*  --  133* 131*  K 4.8  --  5.8* 5.3*  CL 98*  --  98* 97*  CO2 21*  --  19* 18*  GLUCOSE 99  --  83 77  BUN 40*  --  50* 74*  CREATININE 6.47* 7.22* 7.94* 10.04*  CALCIUM 8.1*  --  7.9* 7.6*  PHOS 6.1*  --   --  8.7*   Recent Labs  Lab 01/11/17 1830 01/13/17 0654 01/14/17 0218 01/15/17 0957  AST 41  --  20  --   ALT 44  --  21  --   ALKPHOS 568*  --  337*  --  BILITOT 1.2  --  1.7*  --   PROT 6.9  --  6.1*  --   ALBUMIN 2.9* 2.5* 2.6* 2.4*   Recent Labs  Lab 01/13/17 1808 01/14/17 0218 01/15/17 0955  WBC 10.7* 8.0 8.8  HGB 11.3* 10.6* 10.5*  HCT 35.3* 35.2* 33.4*  MCV 85.1 86.1 82.9  PLT 291 249 264   Iron/TIBC/Ferritin/ %Sat No results found for: IRON, TIBC, FERRITIN, IRONPCTSAT

## 2017-01-15 NOTE — Clinical Social Work Placement (Addendum)
   CLINICAL SOCIAL WORK PLACEMENT  NOTE  Date:  01/15/2017  Patient Details  Name: Philip West MRN: [id number] Date of Birth: September 12, 1970  Clinical Social Work is seeking post-discharge placement for this patient at the [place] level of care (*CSW will initial, date and re-position this form in  chart as items are completed):  Yes   Patient/family provided with [facility] Work Department's list of facilities offering this level of care within the geographic area requested by the patient (or if unable, by the patient's family).  Yes   Patient/family informed of their freedom to choose among providers that offer the needed level of care, that participate in Medicare, Medicaid or managed care program needed by the patient, have an available bed and are willing to accept the patient.  Yes   Patient/family informed of [facility]'s ownership interest in [facility] and [facility], as well as of the fact that they are under no obligation to receive care at these facilities.  PASRR submitted to EDS on 01/14/17     PASRR number received on 01/14/17     Existing PASRR number confirmed on       FL2 transmitted to all facilities in geographic area requested by pt/family on 01/14/17     FL2 transmitted to all facilities within larger geographic area on       Patient informed that his/her managed care company has contracts with or will negotiate with certain facilities, including the following:        Yes - 01/15/17 - Patient/family informed of bed offers received.  Patient chooses bed at       Physician recommends and patient chooses bed at      Patient to be transferred to   on  .  Patient to be transferred to facility by       Patient family notified on   of transfer.  Name of family member notified:        PHYSICIAN       Additional Comment:    _______________________________________________ Sable Feil,  LCSW 01/15/2017, 4:19 PM

## 2017-01-15 NOTE — Care Management Note (Signed)
Case Management Note  Patient Details  Name: Philip West MRN: [id number] Date of Birth: [birth year]  Subjective/Objective:    CM following for progression and d/c planning.                 Action/Plan: 01/15/2017 Met with pt to discuss d/c needs. Noted PT and OT evals recommending SNF. This CM along with CSW, Crawford Givens and intern visited with pt to discuss d/c. Pt is very receptive to short term SNF. CSW Ms Sharlet Salina presenting pt with offers and per pt request discussing with pt POA in [place]. Pt is now inpt status and would be able to d/c to SNF as early as Thursday, 01/17/2017.   Expected Discharge Date:  01/17/2017              Expected Discharge Plan:  [place]  In-House Referral:  Clinical Social Work  Discharge planning Services  CM Consult  Post Acute Care Choice:   NA Choice offered to:   NA  DME Arranged:   NA DME Agency:   NA  HH Arranged:   NA HH Agency:   NA  Status of Service:  Completed, signed off  If discussed at [employer] of Stay Meetings, dates discussed:    Additional Comments:  Adron Bene, RN 01/15/2017, 3:43 PM

## 2017-01-16 LAB — RENAL FUNCTION PANEL
ALBUMIN: 2.3 g/dL — AB (ref 3.5–5.0)
Anion gap: 12 (ref 5–15)
BUN: 41 mg/dL — AB (ref 6–20)
CALCIUM: 7.5 mg/dL — AB (ref 8.9–10.3)
CHLORIDE: 97 mmol/L — AB (ref 101–111)
CO2: 24 mmol/L (ref 22–32)
CREATININE: 6.98 mg/dL — AB (ref 0.61–1.24)
GFR, EST AFRICAN AMERICAN: 10 mL/min — AB (ref >60)
GFR, EST NON AFRICAN AMERICAN: 8 mL/min — AB (ref >60)
Glucose, Bld: 184 mg/dL — ABNORMAL HIGH (ref 65–99)
PHOSPHORUS: 5.8 mg/dL — AB (ref 2.5–4.6)
Potassium: 3.8 mmol/L (ref 3.5–5.1)
SODIUM: 133 mmol/L — AB (ref 135–145)

## 2017-01-16 LAB — CBC
HCT: 33.4 % — ABNORMAL LOW (ref 39.0–52.0)
Hemoglobin: 10.5 g/dL — ABNORMAL LOW (ref 13.0–17.0)
MCH: 26.3 pg (ref 26.0–34.0)
MCHC: 31.4 g/dL (ref 30.0–36.0)
MCV: 83.7 fL (ref 78.0–100.0)
PLATELETS: 251 10*3/uL (ref 150–400)
RBC: 3.99 MIL/uL — ABNORMAL LOW (ref 4.22–5.81)
RDW: 17 % — AB (ref 11.5–15.5)
WBC: 8.2 10*3/uL (ref 4.0–10.5)

## 2017-01-16 LAB — GLUCOSE, CAPILLARY
GLUCOSE-CAPILLARY: 108 mg/dL — AB (ref 65–99)
GLUCOSE-CAPILLARY: 114 mg/dL — AB (ref 65–99)
GLUCOSE-CAPILLARY: 121 mg/dL — AB (ref 65–99)
GLUCOSE-CAPILLARY: 97 mg/dL (ref 65–99)
Glucose-Capillary: 127 mg/dL — ABNORMAL HIGH (ref 65–99)

## 2017-01-16 MED ORDER — DOXERCALCIFEROL 4 MCG/2ML IV SOLN
INTRAVENOUS | Status: AC
  Administered 2017-01-16: 5 ug via INTRAVENOUS
  Filled 2017-01-16: qty 4

## 2017-01-16 MED ORDER — CHOLESTYRAMINE 4 G PO PACK
4.0000 g | PACK | Freq: Four times a day (QID) | ORAL | Status: DC
  Administered 2017-01-16 – 2017-01-18 (×7): 4 g via ORAL
  Filled 2017-01-16 (×12): qty 1

## 2017-01-16 NOTE — Progress Notes (Signed)
[facility] Progress Note  Subjective: no c/o. Patient says "my sister is not able to take care of me at home", and, "I need a lot of care".  Says he can use the bathroom by himself and get around but is not able to make meals or drive to dialysis.   Vitals:   01/16/17 0900 01/16/17 0915 01/16/17 0945 01/16/17 1015  BP: 108/86 (!) 124/100 122/89 122/89  Pulse: 86 88 90 87  Resp: 18 17 18 17   Temp: (!) 97 F (36.1 C)     TempSrc: Oral     SpO2: 96%     Weight: 67.5 kg (148 lb 13 oz)     Height:        Inpatient medications: . doxercalciferol      . aspirin EC  81 mg Oral Daily  . Chlorhexidine Gluconate Cloth  6 each Topical Q0600  . cholestyramine  4 g Oral QID  . doxercalciferol  5 mcg Intravenous Q M,W,F-HD  . ferric citrate  420 mg Oral TID WC  . Folic Acid-Vit G2-IRS W54  1 tablet Oral Daily  . heparin  5,000 Units Subcutaneous Q8H  . heparin  5,000 Units Dialysis Once in dialysis  . hydrocerin   Topical BID  . hydrOXYzine  10 mg Oral TID  . insulin aspart  0-9 Units Subcutaneous TID WC  . lipase/protease/amylase  24,000 Units Oral TID WC  . mupirocin ointment  1 application Nasal BID  . niacin  500 mg Oral QHS  . pantoprazole  40 mg Oral Daily   . sodium chloride    . sodium chloride    . ferric gluconate (FERRLECIT/NULECIT) IV     sodium chloride, sodium chloride, acetaminophen **OR** acetaminophen, alteplase, camphor-menthol, diphenhydrAMINE, heparin, lidocaine (PF), lidocaine-prilocaine, loperamide, oxyCODONE-acetaminophen, pentafluoroprop-tetrafluoroeth, traMADol  Exam: Alert, no distress No jvd Chest cta bilat RRR no mrg Abd soft ntnd no ascites Ext no edema Nonfocal neuro R IJ perm cath  Dialysis:  [facility] MWF 4h 46min    66kg  2/2.25   Hep 6000  RIJ cath (Dr. Oneida Alar apt 01/31/17 vein mapping) Hec 5 mcg IV/HD no Mircera  , Venofer 50mg  q wkly        Impression: 1. Dialysis access malfunction (pt pulled out pc)- sp new perm cath insert  11/25.  Has appt with [facility] (Dr Eden Lathe) on 12/19 for vein mapping. 2. ESRD -  HD MWF. HD today.  3. Hypertension/volume  - bp stable , 1kg up 4. Anemia  - hgb 10.6 no esa / fe weekly wed hd  5. Metabolic bone disease -  hec 3mcg on hd , 6.1 phos , ca , not on op binder start Auryxia  6. HO anoxic Brain injury= needs pt /ot eval , may need SNFP 7. DM type 2 - per admit  8. Nutrition - renal carb mod , / alb 2.6  Protein supplement  9. Social - Patient says "my sister is not able to take care of me at home", and, "I need a lot of care".  Says he can use the bathroom by himself, but is not able to make meals and can't drive to dialysis and probably has other needs.  Reportedly sister unable to care for patient at home, awaiting SNF placement.    Plan - HD today, SNFP   Kelly Splinter MD [facility] pager [phone]   01/16/2017, 10:44 AM   Recent Labs  Lab 01/13/17 0654 01/13/17 1808 01/14/17 8182 01/15/17 9937  NA 132*  --  133* 131*  K 4.8  --  5.8* 5.3*  CL 98*  --  98* 97*  CO2 21*  --  19* 18*  GLUCOSE 99  --  83 77  BUN 40*  --  50* 74*  CREATININE 6.47* 7.22* 7.94* 10.04*  CALCIUM 8.1*  --  7.9* 7.6*  PHOS 6.1*  --   --  8.7*   Recent Labs  Lab 01/11/17 1830 01/13/17 0654 01/14/17 0218 01/15/17 0957  AST 41  --  20  --   ALT 44  --  21  --   ALKPHOS 568*  --  337*  --   BILITOT 1.2  --  1.7*  --   PROT 6.9  --  6.1*  --   ALBUMIN 2.9* 2.5* 2.6* 2.4*   Recent Labs  Lab 01/13/17 1808 01/14/17 0218 01/15/17 0955  WBC 10.7* 8.0 8.8  HGB 11.3* 10.6* 10.5*  HCT 35.3* 35.2* 33.4*  MCV 85.1 86.1 82.9  PLT 291 249 264   Iron/TIBC/Ferritin/ %Sat No results found for: IRON, TIBC, FERRITIN, IRONPCTSAT

## 2017-01-16 NOTE — Progress Notes (Signed)
While making rounds seen patient pulling his HD catheter off. Patient already pulled his NSL out. Took his leg dressings off. Mittens applied to patient. Patient is alert and oriented x 3-4. Patient does not know why he kept pulling things off from him.

## 2017-01-16 NOTE — Progress Notes (Signed)
Patient came back from dialysis without mittens.  He seems cooperative so far.

## 2017-01-16 NOTE — Progress Notes (Signed)
PROGRESS NOTE    Philip West  YQI:[id number] DOB: [birth year] DOA: 01/11/2017 PCP: Vivi Barrack, MD   Outpatient Specialists:  Lorrene Reid  Summary: Philip West is a 46 y.o. male with ESRD on HD since 07/2016 started in [place] along with anoxic brain injury,  He moved to [place] 2 months ago to live with his sister in a 75rd story apartment.  She is not able to care for him.  MPOA lives in [place].  Pending SNF placement but currently wearing mittens as he attempts to pull Vas cath out.      Assessment & Plan:   Active Problems:   End-stage renal disease on hemodialysis ([place])   Hyponatremia   Anoxic brain injury -Patient appears at baseline although clearly has gaps in his ability to reason and memory is extremely high functioning answers questions appropriately -pulling at lines-- appears to be at night.  Qtc prolonged so will repeat EKG to see if zyprexa can be given at night so patient can be removed from mittens (patient has already pulled vas cath in ER and was replaced by Dr. Scot Dock)  Frequent falls -Most likely secondary to anoxic brain injury and deconditioning -PT Eval-- SNF pending  Essential HTN -Controlled without medication  Hypoxia -Although patient states not on home O2 when he falls asleep becomes hypoxic-- outpatient sleep study? -Continue titrate O2 to maintain SPO2>93%  Fever -one episode in ER, none since  ESRD onHDM,/W/F, -HD on Tuesday and now Wednesday  ChronicDiarrhea -increase home meds    Discharge plan per social work and care management.  From [place]-- moved here to live with sister-- unable to return to her home.  SNF placement pending         DVT prophylaxis:  SQ Heparin   Code Status: Full Code   Family Communication: None available  Disposition Plan:  SNF pending   Consultants:   Renal  Social work  Vascular for HD access    Subjective: Found to be pulling at vas cath  last PM -currently wearing mittens NT reports multiple stools (missed dose of questran yesterday)  Objective: Vitals:   01/15/17 1315 01/15/17 1318 01/15/17 2044 01/16/17 0541  BP: (!) 141/104 (!) 136/98 109/77 113/86  Pulse: 100 (!) 101 98 85  Resp:  16 16 16   Temp:  (!) 97.1 F (36.2 C) 97.6 F (36.4 C) (!) 97.4 F (36.3 C)  TempSrc:  Oral Oral Oral  SpO2:  97% 95% 95%  Weight:      Height:        Intake/Output Summary (Last 24 hours) at 01/16/2017 0907 Last data filed at 01/16/2017 0556 Gross per 24 hour  Intake 360 ml  Output 2000 ml  Net -1640 ml   Filed Weights   01/13/17 2053 01/14/17 2051  Weight: 82.1 kg (181 lb) 82.1 kg (181 lb)    Examination:  General exam: NAD- pleasant Respiratory system: no increased work of breathing Cardiovascular system: rrr Gastrointestinal system: +BS      Data Reviewed: I have personally reviewed following labs and imaging studies  CBC: Recent Labs  Lab 01/11/17 1830 01/13/17 1808 01/14/17 0218 01/15/17 0955  WBC 8.0 10.7* 8.0 8.8  HGB 11.9* 11.3* 10.6* 10.5*  HCT 36.7* 35.3* 35.2* 33.4*  MCV 83.0 85.1 86.1 82.9  PLT 261 291 249 387   Basic Metabolic Panel: Recent Labs  Lab 01/11/17 1830 01/13/17 0654 01/13/17 1808 01/14/17 0218 01/15/17 0957  NA 137 132*  --  133* 131*  K 3.4* 4.8  --  5.8* 5.3*  CL 100* 98*  --  98* 97*  CO2 22 21*  --  19* 18*  GLUCOSE 106* 99  --  83 77  BUN 19 40*  --  50* 74*  CREATININE 3.88* 6.47* 7.22* 7.94* 10.04*  CALCIUM 7.7* 8.1*  --  7.9* 7.6*  MG  --   --   --  1.9  --   PHOS  --  6.1*  --   --  8.7*   GFR: Estimated Creatinine Clearance: 10.1 mL/min (A) (by C-G formula based on SCr of 10.04 mg/dL (H)). Liver Function Tests: Recent Labs  Lab 01/11/17 1830 01/13/17 0654 01/14/17 0218 01/15/17 0957  AST 41  --  20  --   ALT 44  --  21  --   ALKPHOS 568*  --  337*  --   BILITOT 1.2  --  1.7*  --   PROT 6.9  --  6.1*  --   ALBUMIN 2.9* 2.5* 2.6* 2.4*   Recent  Labs  Lab 01/11/17 1830  LIPASE 18   No results for input(s): AMMONIA in the last 168 hours. Coagulation Profile: No results for input(s): INR, PROTIME in the last 168 hours. Cardiac Enzymes: No results for input(s): CKTOTAL, CKMB, CKMBINDEX, TROPONINI in the last 168 hours. BNP (last 3 results) No results for input(s): PROBNP in the last 8760 hours. HbA1C: No results for input(s): HGBA1C in the last 72 hours. CBG: Recent Labs  Lab 01/15/17 1537 01/15/17 1648 01/15/17 2042 01/16/17 0043 01/16/17 0802  GLUCAP 76 97 134* 97 121*   Lipid Profile: No results for input(s): CHOL, HDL, LDLCALC, TRIG, CHOLHDL, LDLDIRECT in the last 72 hours. Thyroid Function Tests: No results for input(s): TSH, T4TOTAL, FREET4, T3FREE, THYROIDAB in the last 72 hours. Anemia Panel: No results for input(s): VITAMINB12, FOLATE, FERRITIN, TIBC, IRON, RETICCTPCT in the last 72 hours. Urine analysis: No results found for: COLORURINE, APPEARANCEUR, [place], [place], [place], [place], [place], [place], [place], [place], NITRITE, LEUKOCYTESUR   ) Recent Results (from the past 240 hour(s))  MRSA PCR Screening     Status: Abnormal   Collection Time: 01/14/17 11:34 PM  Result Value Ref Range Status   MRSA by PCR POSITIVE (A) NEGATIVE Final    Comment:        The GeneXpert MRSA Assay (FDA approved for NASAL specimens only), is one component of a comprehensive MRSA colonization surveillance program. It is not intended to diagnose MRSA infection nor to guide or monitor treatment for MRSA infections. RESULT CALLED TO, READ BACK BY AND VERIFIED WITH: E CASTRO RN 0330 01/15/17 A BROWNING       Anti-infectives (From admission, onward)   Start     Dose/Rate Route Frequency Ordered Stop   01/13/17 0745  ceFAZolin (ANCEF) IVPB 1 g/50 mL premix    Comments:  Send with pt to OR   1 g 100 mL/hr over 30 Minutes Intravenous On call 01/13/17 0734 01/13/17 1027       Radiology Studies: No  results found.      Scheduled Meds: . aspirin EC  81 mg Oral Daily  . Chlorhexidine Gluconate Cloth  6 each Topical Q0600  . cholestyramine  4 g Oral QID  . doxercalciferol  5 mcg Intravenous Q M,W,F-HD  . ferric citrate  420 mg Oral TID WC  . Folic Acid-Vit W1-XBJ Y78  1 tablet Oral Daily  . heparin  5,000 Units Subcutaneous Q8H  . heparin  5,000 Units Dialysis Once in dialysis  . hydrocerin   Topical BID  . hydrOXYzine  10 mg Oral TID  . insulin aspart  0-9 Units Subcutaneous TID WC  . lipase/protease/amylase  24,000 Units Oral TID WC  . mupirocin ointment  1 application Nasal BID  . niacin  500 mg Oral QHS  . pantoprazole  40 mg Oral Daily   Continuous Infusions: . sodium chloride    . sodium chloride    . ferric gluconate (FERRLECIT/NULECIT) IV       LOS: 2 days    Time spent: 15 min    Geradine Girt, DO [facility] Pager [phone]  If 7PM-7AM, please contact night-coverage [email or web address] Password [email or web address] 01/16/2017, 9:07 AM

## 2017-01-17 DIAGNOSIS — E871 Hypo-osmolality and hyponatremia: Secondary | ICD-10-CM

## 2017-01-17 DIAGNOSIS — Z95828 Presence of other vascular implants and grafts: Secondary | ICD-10-CM

## 2017-01-17 DIAGNOSIS — R531 Weakness: Secondary | ICD-10-CM

## 2017-01-17 DIAGNOSIS — Z992 Dependence on renal dialysis: Secondary | ICD-10-CM

## 2017-01-17 DIAGNOSIS — Z419 Encounter for procedure for purposes other than remedying health state, unspecified: Secondary | ICD-10-CM

## 2017-01-17 DIAGNOSIS — N186 End stage renal disease: Secondary | ICD-10-CM

## 2017-01-17 LAB — GLUCOSE, CAPILLARY
Glucose-Capillary: 117 mg/dL — ABNORMAL HIGH (ref 65–99)
Glucose-Capillary: 105 mg/dL — ABNORMAL HIGH (ref 65–99)
Glucose-Capillary: 141 mg/dL — ABNORMAL HIGH (ref 65–99)
Glucose-Capillary: 96 mg/dL (ref 65–99)
Glucose-Capillary: 121 mg/dL — ABNORMAL HIGH (ref 65–99)

## 2017-01-17 MED ORDER — NEPRO/CARBSTEADY PO LIQD
237.0000 mL | Freq: Two times a day (BID) | ORAL | Status: DC
  Administered 2017-01-17 – 2017-01-18 (×2): 237 mL via ORAL
  Filled 2017-01-17 (×5): qty 237

## 2017-01-17 NOTE — Progress Notes (Signed)
Subjective:  Hd yesterday  without cos  / working with OT  Sitting up on bed side/   awaiting  NHP/   Objective Vital signs in last 24 hours: Vitals:   01/16/17 1800 01/16/17 2236 01/17/17 0500 01/17/17 1000  BP: 118/76 116/72 107/76 116/80  Pulse: 82 100 97 96  Resp: 18 18 18 18   Temp: 98.2 F (36.8 C) 99.4 F (37.4 C) 98.7 F (37.1 C) 98.5 F (36.9 C)  TempSrc: Oral Oral Oral Oral  SpO2: 96% 96% 95% 98%  Weight:      Height:       Weight change:   Physical Exam: General: alert thin/cachetic chronically ill   AAM to place and person only , Heart: RRR, no mrg Lungs: CTA , nolabored breathing  Abdomen: bs pos, soft ,nontender, ND Extremities: No pedal edema Dialysis Access: R IJ perm cath     Op Dialysis:  [facility] MWF 4h 43min    66kg  2/2.25   Hep 6000  RIJ cath (Dr. Oneida Alar apt 01/31/17 vein mapping) Hec 40mcg IV/HDno Mircera ,Venofer 50mg  q wkly      Problem/Plan: 1. Dialysis access malfunction (pt pulled out pc)- sp new perm cath insert 11/25. Has appt with [facility] (Dr Eden Lathe) on 12/19 for vein mapping. 2. ESRD -HD MWF. HD on schedule   3. Hypertension/volume - bp stable, 1kg up 4. Anemia - hgb 10.5 no esa / fe weekly  hd 5. Metabolic bone disease -hec 39mcg on hd ,6.1>8.1 5.8 phos , Corec ca  8.8 , now on   [place] phos binder 6. HO anoxic Brain injury= needs SNFP 7. DM type 2 - per admit 8. Nutrition -renal carb mod , / alb 2.3 Protein supplement  9. Social - sister unable to care for patient at home( lives in 2nd story apt. ), awaiting SNF placement.   Ernest Haber, PA-C [facility] Beeper [phone] 01/17/2017,12:16 PM  LOS: 3 days   Pt seen, examined and agree w A/P as above.  Kelly Splinter MD [facility] pager [phone]   01/17/2017, 2:02 PM    Labs: Basic Metabolic Panel: Recent Labs  Lab 01/13/17 0654  01/14/17 0218 01/15/17 0957 01/16/17 1113  NA 132*  --  133* 131* 133*  K 4.8  --  5.8* 5.3* 3.8   CL 98*  --  98* 97* 97*  CO2 21*  --  19* 18* 24  GLUCOSE 99  --  83 77 184*  BUN 40*  --  50* 74* 41*  CREATININE 6.47*   < > 7.94* 10.04* 6.98*  CALCIUM 8.1*  --  7.9* 7.6* 7.5*  PHOS 6.1*  --   --  8.7* 5.8*   < > = values in this interval not displayed.   Liver Function Tests: Recent Labs  Lab 01/11/17 1830  01/14/17 0218 01/15/17 0957 01/16/17 1113  AST 41  --  20  --   --   ALT 44  --  21  --   --   ALKPHOS 568*  --  337*  --   --   BILITOT 1.2  --  1.7*  --   --   PROT 6.9  --  6.1*  --   --   ALBUMIN 2.9*   < > 2.6* 2.4* 2.3*   < > = values in this interval not displayed.   Recent Labs  Lab 01/11/17 1830  LIPASE 18   No results for input(s): AMMONIA in the last  168 hours. CBC: Recent Labs  Lab 01/11/17 1830 01/13/17 1808 01/14/17 0218 01/15/17 0955 01/16/17 1113  WBC 8.0 10.7* 8.0 8.8 8.2  HGB 11.9* 11.3* 10.6* 10.5* 10.5*  HCT 36.7* 35.3* 35.2* 33.4* 33.4*  MCV 83.0 85.1 86.1 82.9 83.7  PLT 261 291 249 264 251   Cardiac Enzymes: No results for input(s): CKTOTAL, CKMB, CKMBINDEX, TROPONINI in the last 168 hours. CBG: Recent Labs  Lab 01/16/17 1346 01/16/17 1627 01/16/17 2230 01/17/17 0504 01/17/17 0739  GLUCAP 108* 127* 114* 117* 105*    Studies/Results: No results found. Medications: . sodium chloride    . sodium chloride    . ferric gluconate (FERRLECIT/NULECIT) IV 62.5 mg (01/16/17 1121)   . aspirin EC  81 mg Oral Daily  . Chlorhexidine Gluconate Cloth  6 each Topical Q0600  . cholestyramine  4 g Oral QID  . doxercalciferol  5 mcg Intravenous Q M,W,F-HD  . ferric citrate  420 mg Oral TID WC  . Folic Acid-Vit H8-ION G29  1 tablet Oral Daily  . heparin  5,000 Units Subcutaneous Q8H  . heparin  5,000 Units Dialysis Once in dialysis  . hydrocerin   Topical BID  . hydrOXYzine  10 mg Oral TID  . insulin aspart  0-9 Units Subcutaneous TID WC  . lipase/protease/amylase  24,000 Units Oral TID WC  . mupirocin ointment  1 application Nasal  BID  . niacin  500 mg Oral QHS  . pantoprazole  40 mg Oral Daily

## 2017-01-17 NOTE — Progress Notes (Signed)
Occupational Therapy Treatment Patient Details Name: Philip West MRN: [id number] DOB: Apr 14, 1970 Today's Date: 01/17/2017    History of present illness 46 y.o. male admittedon 01/11/17 from his sister's home with declining functional status, frequent falls, vomiting and diarrhea. Sister works and is not able to provide adequate care of pt. PMH: ESRD on HD x 3 months, HTN, pancreatic tumor, DM, anoxic brain injury February 2018.    OT comments  Pt progressing towards acute OT goals. Focus of session was sitting EOB to complete grooming tasks. Some cueing and setup needed, delayed initiation of task. Pt alert, flat affect, and talking in complete sentences. Of, note pt with prolonged intense coughing spell x2 after sipping apple juice through straw. Recommend speech consult for evaluating swallowing.    Follow Up Recommendations  SNF;Supervision/Assistance - 24 hour    Equipment Recommendations       Recommendations for Other Services  Speech consult (swallowing)    Precautions / Restrictions Precautions Precautions: Fall Precaution Comments: IJ catheter Restrictions Weight Bearing Restrictions: No       Mobility Bed Mobility Overal bed mobility: Needs Assistance Bed Mobility: Supine to Sit;Sit to Supine     Supine to sit: Min assist;HOB elevated;Min guard     General bed mobility comments: + bed rail, extra time and effort. moaning.  Transfers                      Balance Overall balance assessment: Needs assistance Sitting-balance support: Feet supported;No upper extremity supported Sitting balance-Leahy Scale: Fair                                     ADL either performed or assessed with clinical judgement   ADL Overall ADL's : Needs assistance/impaired     Grooming: Oral care;Set up;Minimal assistance;Sitting;Wash/dry face Grooming Details (indicate cue type and reason): Pt with delay initiating task. Opened tooth paste container  with mouth and R hand.                                General ADL Comments: Sat EOB about 10 minutes to complete grooming tasks.      Vision       Perception     Praxis      Cognition Arousal/Alertness: Awake/alert Behavior During Therapy: Flat affect Overall Cognitive Status: No family/caregiver present to determine baseline cognitive functioning                                          Exercises     Shoulder Instructions       General Comments      Pertinent Vitals/ Pain       Pain Assessment: Faces Faces Pain Scale: Hurts even more Pain Location: R hip Pain Descriptors / Indicators: Grimacing;Guarding Pain Intervention(s): Limited activity within patient's tolerance;Monitored during session;Repositioned  Home Living                                          Prior Functioning/Environment              Frequency  Min 2X/week  Progress Toward Goals  OT Goals(current goals can now be found in the care plan section)  Progress towards OT goals: Progressing toward goals  Acute Rehab OT Goals Patient Stated Goal: to get stronger  OT Goal Formulation: With patient Time For Goal Achievement: 01/21/17 Potential to Achieve Goals: Fair ADL Goals Pt Will Perform Grooming: with supervision;with set-up;sitting Pt Will Perform Upper Body Bathing: with min assist;sitting Pt Will Perform Upper Body Dressing: with supervision;with set-up;sitting Pt Will Transfer to Toilet: stand pivot transfer;bedside commode;with min assist  Plan Discharge plan remains appropriate    Co-evaluation                 AM-PAC PT "6 Clicks" Daily Activity     Outcome Measure   Help from another person eating meals?: A Little Help from another person taking care of personal grooming?: A Little Help from another person toileting, which includes using toliet, bedpan, or urinal?: Total Help from another person bathing  (including washing, rinsing, drying)?: A Lot Help from another person to put on and taking off regular upper body clothing?: A Lot Help from another person to put on and taking off regular lower body clothing?: Total 6 Click Score: 12    End of Session    OT Visit Diagnosis: Unsteadiness on feet (R26.81);Repeated falls (R29.6);Muscle weakness (generalized) (M62.81);History of falling (Z91.81);Other symptoms and signs involving the nervous system (R29.898);Pain Pain - Right/Left: Right Pain - part of body: Hip   Activity Tolerance Patient limited by fatigue;Patient tolerated treatment well;Patient limited by pain   Patient Left in bed;with call bell/phone within reach;with bed alarm set   Nurse Communication          Time: 2130-8657 OT Time Calculation (min): 28 min  Charges: OT General Charges $OT Visit: 1 Visit OT Treatments $Self Care/Home Management : 23-37 mins     Hortencia Pilar 01/17/2017, 1:01 PM

## 2017-01-17 NOTE — Care Management Important Message (Signed)
Important Message  Patient Details  Name: Philip West MRN: [id number] Date of Birth: 03-06-1970   Medicare Important Message Given:  Yes    Boneta Standre, Rory Percy, RN 01/17/2017, 10:45 AM

## 2017-01-17 NOTE — Clinical Social Work Note (Signed)
CSW continuing to follow and working with family and skilled nursing facility on discharge disposition. [facility] chosen by patient's sister, [name] [phone]) and they can accept patient when ready for discharge. Per MD, patient will discharge on Friday, 11/30 and CSW will facilitate discharge to [facility] if no changes in medical status.  Jamisen Duerson Givens, MSW, LCSW Licensed Clinical Social Worker [place] [phone]

## 2017-01-17 NOTE — Evaluation (Signed)
Clinical/Bedside Swallow Evaluation Patient Details  Name: Philip West MRN: [id number] Date of Birth: 1970/08/19  Today's Date: 01/17/2017 Time: SLP Start Time (ACUTE ONLY): 1529 SLP Stop Time (ACUTE ONLY): 1539 SLP Time Calculation (min) (ACUTE ONLY): 10 min  Past Medical History:  Past Medical History:  Diagnosis Date  . Allergy   . Anoxic brain injury ([place])   . Diabetes mellitus without complication ([place])   . Hyperlipidemia   . Hypertension   . Kidney failure   . Pancreatic tumor   . Picker's nodule   . [facility])    Past Surgical History:  Past Surgical History:  Procedure Laterality Date  . EYE SURGERY Left   . INSERTION OF DIALYSIS CATHETER Right 01/13/2017   Procedure: INSERTION OF DIALYSIS CATHETER - Right Internal Jugular Placement;  Surgeon: Angelia Mould, MD;  Location: [facility] OR;  Service: Vascular;  Laterality: Right;   HPI:  46 y.o. male admittedon 01/11/17 from his sister's home with declining functional status, frequent falls, vomiting and diarrhea. Sister works and is not able to provide adequate care of pt. PMH: ESRD on HD x 3 months, HTN, pancreatic tumor, DM, anoxic brain injury February 2018.    Assessment / Plan / Recommendation Clinical Impression  Patient presents with a normal oropharyngeal swallow. Swift oral transit of bolus and no overt indication of aspiration noted. Patient impulsive with po intake which could account for large consecutive sip sizes and increased risk for aspiration. Encouraged patient to sit upright and consume pos via small single bites/sips. No SLP f/u indicated at this time.  SLP Visit Diagnosis: Dysphagia, unspecified (R13.10)    Aspiration Risk  Mild aspiration risk    Diet Recommendation Regular;Thin liquid   Liquid Administration via: Cup;Straw Medication Administration: Whole meds with liquid Supervision: Patient able to self feed;Intermittent supervision to cue for compensatory strategies Compensations:  Slow rate;Small sips/bites Postural Changes: Seated upright at 90 degrees    Other  Recommendations Oral Care Recommendations: Oral care BID   Follow up Recommendations None        Swallow Study   General HPI: 46 y.o. male admittedon 01/11/17 from his sister's home with declining functional status, frequent falls, vomiting and diarrhea. Sister works and is not able to provide adequate care of pt. PMH: ESRD on HD x 3 months, HTN, pancreatic tumor, DM, anoxic brain injury February 2018.  Type of Study: Bedside Swallow Evaluation Previous Swallow Assessment: none known Diet Prior to this Study: Regular;NPO Temperature Spikes Noted: No Respiratory Status: Room air History of Recent Intubation: No Behavior/Cognition: Alert;Cooperative;Pleasant mood Oral Cavity Assessment: Within Functional Limits Oral Care Completed by SLP: Recent completion by staff Oral Cavity - Dentition: Adequate natural dentition Vision: Functional for self-feeding Self-Feeding Abilities: Able to feed self Patient Positioning: Upright in bed Baseline Vocal Quality: Normal Volitional Cough: Strong Volitional Swallow: Able to elicit    Oral/Motor/Sensory Function Overall Oral Motor/Sensory Function: Within functional limits   Ice Chips Ice chips: Not tested   Thin Liquid Thin Liquid: Within functional limits Presentation: Cup;Self Fed;Straw    Nectar Thick Nectar Thick Liquid: Not tested   Honey Thick Honey Thick Liquid: Not tested   Puree Puree: Not tested   Solid   Philip Bruso MA, CCC-SLP [phone]    Solid: Within functional limits Presentation: Self Fed        Philip West [place] 01/17/2017,3:42 PM

## 2017-01-17 NOTE — Progress Notes (Addendum)
PROGRESS NOTE    Philip West  RXV:[id number] DOB: 10-19-1970 DOA: 01/11/2017 PCP: Vivi Barrack, MD   Chief Complaint  Patient presents with  . Emesis    Brief Narrative:  HPI on 01/12/2017 by Dr. Dia Crawford Philip West is a 46 y.o. male  moved here from [place] several months ago  PMHx ESRD on HD M,/W/F, frequent falls DM Type 2, Essential HTN, Pancreatic Tumor. Anoxic brain injury (found down by [facility] in [place] in February), Per patient has had diarrhea for 10 years (chronic). Negative CP, negative SOB, negative abdominal pain, negative N/V. States does not use home O2. Patient stated approximate 2 weeks ago had rash on his torso arms pustular in nature, extremely itchy.  Assessment & Plan   Anoxic brain injury -Appears to be at baseline and answering questions appropriately -Patient did require medicines however currently does not have that in place -Vascular surgery consulted and appreciated as patient pulled out his vascular cath in the emergency department -Continues to have mildly prolonged QTC, continue to hold Zyprexa  ESRD/ Dialysis access malfunction -Nephrology consulted and appreicated -dialyzes MWF -As above  Frequent falls -Likely secondary to the above and deconditioning -PT/OT rec SNF  Hypoxia -occurs with sleeping -likely OSA, needs outpatient sleep study  Essential hypertension -Currently not on any medications, stable  Chronic diarrhea -Continue home meds, increased  Diabetes mellitus, type II -Continue insulin sliding scale  Possible dysphagia -While working with OT, patient had an intense coughing spell after sipping orange juice through a straw -Speech consulted  DVT Prophylaxis  heparin  Code Status: Full  Family Communication: None at bedside  Disposition Plan: Admitted, pending speech eval and SNF placement  Consultants Nephrology Vascular surgery, ED  Procedures  none  Antibiotics   Anti-infectives  (From admission, onward)   Start     Dose/Rate Route Frequency Ordered Stop   01/13/17 0745  ceFAZolin (ANCEF) IVPB 1 g/50 mL premix    Comments:  Send with pt to OR   1 g 100 mL/hr over 30 Minutes Intravenous On call 01/13/17 0734 01/13/17 1027      Subjective:   Doristine Counter seen and examined today.  Has no complaints today. Denies chest pain, shortness of breath, abdominal pain.    Objective:   Vitals:   01/16/17 1800 01/16/17 2236 01/17/17 0500 01/17/17 1000  BP: 118/76 116/72 107/76 116/80  Pulse: 82 100 97 96  Resp: 18 18 18 18   Temp: 98.2 F (36.8 C) 99.4 F (37.4 C) 98.7 F (37.1 C) 98.5 F (36.9 C)  TempSrc: Oral Oral Oral Oral  SpO2: 96% 96% 95% 98%  Weight:      Height:        Intake/Output Summary (Last 24 hours) at 01/17/2017 1423 Last data filed at 01/17/2017 0900 Gross per 24 hour  Intake 705 ml  Output 0 ml  Net 705 ml   Filed Weights   01/16/17 0900 01/16/17 1230  Weight: 67.5 kg (148 lb 13 oz) 66.8 kg (147 lb 4.3 oz)    Exam  General: Well developed, chronically ill-appearing, thin, in no apparent distress  HEENT: NCAT, mucous membranes moist.   Cardiovascular: S1 S2 auscultated, RRR, no murmur  Respiratory: Clear to auscultation bilaterally with equal chest rise  Abdomen: Soft, nontender, nondistended, + bowel sounds  Extremities: warm dry without cyanosis clubbing or edema  Neuro: AAOx2 (self, place), nonfocal  Psych: appropriate   Data Reviewed: I have personally reviewed following labs and imaging studies  CBC: Recent Labs  Lab 01/11/17 1830 01/13/17 1808 01/14/17 0218 01/15/17 0955 01/16/17 1113  WBC 8.0 10.7* 8.0 8.8 8.2  HGB 11.9* 11.3* 10.6* 10.5* 10.5*  HCT 36.7* 35.3* 35.2* 33.4* 33.4*  MCV 83.0 85.1 86.1 82.9 83.7  PLT 261 291 249 264 875   Basic Metabolic Panel: Recent Labs  Lab 01/11/17 1830 01/13/17 0654 01/13/17 1808 01/14/17 0218 01/15/17 0957 01/16/17 1113  NA 137 132*  --  133* 131* 133*  K  3.4* 4.8  --  5.8* 5.3* 3.8  CL 100* 98*  --  98* 97* 97*  CO2 22 21*  --  19* 18* 24  GLUCOSE 106* 99  --  83 77 184*  BUN 19 40*  --  50* 74* 41*  CREATININE 3.88* 6.47* 7.22* 7.94* 10.04* 6.98*  CALCIUM 7.7* 8.1*  --  7.9* 7.6* 7.5*  MG  --   --   --  1.9  --   --   PHOS  --  6.1*  --   --  8.7* 5.8*   GFR: Estimated Creatinine Clearance: 12.5 mL/min (A) (by C-G formula based on SCr of 6.98 mg/dL (H)). Liver Function Tests: Recent Labs  Lab 01/11/17 1830 01/13/17 0654 01/14/17 0218 01/15/17 0957 01/16/17 1113  AST 41  --  20  --   --   ALT 44  --  21  --   --   ALKPHOS 568*  --  337*  --   --   BILITOT 1.2  --  1.7*  --   --   PROT 6.9  --  6.1*  --   --   ALBUMIN 2.9* 2.5* 2.6* 2.4* 2.3*   Recent Labs  Lab 01/11/17 1830  LIPASE 18   No results for input(s): AMMONIA in the last 168 hours. Coagulation Profile: No results for input(s): INR, PROTIME in the last 168 hours. Cardiac Enzymes: No results for input(s): CKTOTAL, CKMB, CKMBINDEX, TROPONINI in the last 168 hours. BNP (last 3 results) No results for input(s): PROBNP in the last 8760 hours. HbA1C: No results for input(s): HGBA1C in the last 72 hours. CBG: Recent Labs  Lab 01/16/17 1627 01/16/17 2230 01/17/17 0504 01/17/17 0739 01/17/17 1151  GLUCAP 127* 114* 117* 105* 141*   Lipid Profile: No results for input(s): CHOL, HDL, LDLCALC, TRIG, CHOLHDL, LDLDIRECT in the last 72 hours. Thyroid Function Tests: No results for input(s): TSH, T4TOTAL, FREET4, T3FREE, THYROIDAB in the last 72 hours. Anemia Panel: No results for input(s): VITAMINB12, FOLATE, FERRITIN, TIBC, IRON, RETICCTPCT in the last 72 hours. Urine analysis: No results found for: COLORURINE, APPEARANCEUR, LABSPEC, [place], GLUCOSEU, HGBUR, BILIRUBINUR, [place], [place], UROBILINOGEN, NITRITE, LEUKOCYTESUR Sepsis Labs: @LABRCNTIP (procalcitonin:4,lacticidven:4)  ) Recent Results (from the past 240 hour(s))  MRSA PCR Screening     Status:  Abnormal   Collection Time: 01/14/17 11:34 PM  Result Value Ref Range Status   MRSA by PCR POSITIVE (A) NEGATIVE Final    Comment:        The GeneXpert MRSA Assay (FDA approved for NASAL specimens only), is one component of a comprehensive MRSA colonization surveillance program. It is not intended to diagnose MRSA infection nor to guide or monitor treatment for MRSA infections. RESULT CALLED TO, READ BACK BY AND VERIFIED WITH: E CASTRO RN 0330 01/15/17 A BROWNING       Radiology Studies: No results found.   Scheduled Meds: . aspirin EC  81 mg Oral Daily  . Chlorhexidine Gluconate Cloth  6 each Topical  K9983  . cholestyramine  4 g Oral QID  . doxercalciferol  5 mcg Intravenous Q M,W,F-HD  . feeding supplement (NEPRO CARB STEADY)  237 mL Oral BID BM  . ferric citrate  420 mg Oral TID WC  . Folic Acid-Vit J8-SNK N39  1 tablet Oral Daily  . heparin  5,000 Units Subcutaneous Q8H  . heparin  5,000 Units Dialysis Once in dialysis  . hydrocerin   Topical BID  . hydrOXYzine  10 mg Oral TID  . insulin aspart  0-9 Units Subcutaneous TID WC  . lipase/protease/amylase  24,000 Units Oral TID WC  . mupirocin ointment  1 application Nasal BID  . niacin  500 mg Oral QHS  . pantoprazole  40 mg Oral Daily   Continuous Infusions: . sodium chloride    . sodium chloride    . ferric gluconate (FERRLECIT/NULECIT) IV 62.5 mg (01/16/17 1121)     LOS: 3 days   Time Spent in minutes   30 minutes  Tisa Weisel D.O. on 01/17/2017 at 2:23 PM  Between 7am to 7pm - Pager - [phone]  After 7pm go to [email or web address] - password [email or web address]  And look for the night coverage person covering for me after hours  [facility] Office  [phone]

## 2017-01-18 LAB — GLUCOSE, CAPILLARY
GLUCOSE-CAPILLARY: 105 mg/dL — AB (ref 65–99)
Glucose-Capillary: 113 mg/dL — ABNORMAL HIGH (ref 65–99)
Glucose-Capillary: 157 mg/dL — ABNORMAL HIGH (ref 65–99)

## 2017-01-18 LAB — CBC
HCT: 32.6 % — ABNORMAL LOW (ref 39.0–52.0)
Hemoglobin: 10.3 g/dL — ABNORMAL LOW (ref 13.0–17.0)
MCH: 26.8 pg (ref 26.0–34.0)
MCHC: 31.6 g/dL (ref 30.0–36.0)
MCV: 84.9 fL (ref 78.0–100.0)
PLATELETS: 233 10*3/uL (ref 150–400)
RBC: 3.84 MIL/uL — AB (ref 4.22–5.81)
RDW: 17.3 % — ABNORMAL HIGH (ref 11.5–15.5)
WBC: 8.1 10*3/uL (ref 4.0–10.5)

## 2017-01-18 LAB — BASIC METABOLIC PANEL
Anion gap: 12 (ref 5–15)
BUN: 37 mg/dL — ABNORMAL HIGH (ref 6–20)
CHLORIDE: 101 mmol/L (ref 101–111)
CO2: 18 mmol/L — ABNORMAL LOW (ref 22–32)
CREATININE: 7.35 mg/dL — AB (ref 0.61–1.24)
Calcium: 7.6 mg/dL — ABNORMAL LOW (ref 8.9–10.3)
GFR, EST AFRICAN AMERICAN: 9 mL/min — AB (ref >60)
GFR, EST NON AFRICAN AMERICAN: 8 mL/min — AB (ref >60)
Glucose, Bld: 119 mg/dL — ABNORMAL HIGH (ref 65–99)
Potassium: 3.9 mmol/L (ref 3.5–5.1)
SODIUM: 131 mmol/L — AB (ref 135–145)

## 2017-01-18 MED ORDER — HYDROCERIN EX CREA: 1.0000 "application " | TOPICAL_CREAM | Freq: Two times a day (BID) | CUTANEOUS | 0 refills | Status: AC

## 2017-01-18 MED ORDER — INSULIN ASPART 100 UNIT/ML ~~LOC~~ SOLN: 0.0000 [IU] | Freq: Three times a day (TID) | SUBCUTANEOUS | 11 refills | Status: AC

## 2017-01-18 MED ORDER — CAMPHOR-MENTHOL 0.5-0.5 % EX LOTN: TOPICAL_LOTION | CUTANEOUS | 0 refills | Status: AC | PRN

## 2017-01-18 MED ORDER — LORAZEPAM 2 MG/ML IJ SOLN
0.5000 mg | Freq: Once | INTRAMUSCULAR | Status: AC
  Administered 2017-01-18: 0.5 mg via INTRAVENOUS

## 2017-01-18 MED ORDER — DOXERCALCIFEROL 4 MCG/2ML IV SOLN
INTRAVENOUS | Status: AC
  Filled 2017-01-18: qty 4

## 2017-01-18 MED ORDER — TRAMADOL HCL 50 MG PO TABS: 50.0000 mg | ORAL_TABLET | Freq: Two times a day (BID) | ORAL | 0 refills | Status: AC | PRN

## 2017-01-18 MED ORDER — NEPRO/CARBSTEADY PO LIQD: 237.0000 mL | Freq: Two times a day (BID) | ORAL | 0 refills | Status: AC

## 2017-01-18 MED ORDER — ASPIRIN 81 MG PO TBEC: 81.0000 mg | DELAYED_RELEASE_TABLET | Freq: Every day | ORAL | Status: AC

## 2017-01-18 MED ORDER — TRAMADOL HCL 50 MG PO TABS
ORAL_TABLET | ORAL | Status: AC
  Filled 2017-01-18: qty 1

## 2017-01-18 MED ORDER — MUPIROCIN 2 % EX OINT: 1.0000 "application " | TOPICAL_OINTMENT | Freq: Two times a day (BID) | CUTANEOUS | 0 refills | Status: AC

## 2017-01-18 MED ORDER — LORAZEPAM 2 MG/ML IJ SOLN
INTRAMUSCULAR | Status: AC
  Filled 2017-01-18: qty 1

## 2017-01-18 NOTE — Clinical Social Work Placement (Signed)
   CLINICAL SOCIAL WORK PLACEMENT  NOTE  Date:  01/18/2017  Patient Details  Name: Philip West MRN: [id number] Date of Birth: 30-Sep-1970  Clinical Social Work is seeking post-discharge placement for this patient at the [place] level of care (*CSW will initial, date and re-position this form in  chart as items are completed):  Yes   Patient/family provided with [facility] Work Department's list of facilities offering this level of care within the geographic area requested by the patient (or if unable, by the patient's family).  Yes   Patient/family informed of their freedom to choose among providers that offer the needed level of care, that participate in Medicare, Medicaid or managed care program needed by the patient, have an available bed and are willing to accept the patient.  Yes   Patient/family informed of [facility]'s ownership interest in [facility] and [facility], as well as of the fact that they are under no obligation to receive care at these facilities.  PASRR submitted to EDS on 01/14/17     PASRR number received on 01/14/17     Existing PASRR number confirmed on       FL2 transmitted to all facilities in geographic area requested by pt/family on 01/14/17     FL2 transmitted to all facilities within larger geographic area on       Patient informed that his/her managed care company has contracts with or will negotiate with certain facilities, including the following:        Yes   Patient/family informed of bed offers received.  Patient chooses bed at  [facility] recommends and patient chooses bed at      Patient to be transferred to  [facility] on  01/18/17.  Patient to be transferred to facility by  ambulance     Patient family notified on  01/18/17 of transfer.  Name of family member notified:   Sister [name] - [phone]; POA [place] Hough [phone].  PHYSICIAN       Additional Comment:    _______________________________________________ Sable Feil, LCSW 01/18/2017, 4:29 PM

## 2017-01-18 NOTE — Progress Notes (Signed)
Philip West to be D/C'd Skilled nursing facility per MD order.  Discussed prescriptions and follow up appointments with the patient. Prescriptions given to patient, medication list explained in detail. Pt verbalized understanding.  Allergies as of 01/18/2017   No Known Allergies     Medication List    TAKE these medications   aspirin 81 MG EC tablet Take 1 tablet (81 mg total) by mouth daily.   b complex-vitamin c-folic acid 0.8 MG Tabs tablet Take 1 tablet by mouth daily.   camphor-menthol lotion Commonly known as:  SARNA Apply topically as needed for itching.   cholestyramine 4 g packet Commonly known as:  QUESTRAN Take 1 packet (4 g total) by mouth 2 (two) times daily.   feeding supplement (NEPRO CARB STEADY) Liqd Take 237 mLs by mouth 2 (two) times daily between meals.   folic acid-pyridoxine-cyancobalamin 2.5-25-2 MG Tabs tablet Commonly known as:  FOLTX Take 1 tablet by mouth daily.   hydrocerin Crea Apply 1 application topically 2 (two) times daily.   insulin detemir 100 UNIT/ML injection Commonly known as:  LEVEMIR Inject 0.1-0.15 mLs (10-15 Units total) into the skin 2 (two) times daily. 15 units at 6 am and 10 units at 6 pm   insulin lispro 100 UNIT/ML injection Commonly known as:  HUMALOG Inject 0-12 Units into the skin 2 (two) times daily. Per sliding scale   lipase/protease/amylase 12000 units Cpep capsule Commonly known as:  CREON Take 2 capsules (24,000 Units total) by mouth 3 (three) times daily with meals. What changed:  Another medication with the same name was removed. Continue taking this medication, and follow the directions you see here.   loperamide 2 MG tablet Commonly known as:  IMODIUM A-D Take 1 tablet (2 mg total) by mouth 4 (four) times daily as needed for diarrhea or loose stools.   mupirocin ointment 2 % Commonly known as:  BACTROBAN Place 1 application into the nose 2 (two) times daily for 4 days.   niacin 500 MG tablet Take  1 tablet (500 mg total) by mouth at bedtime.   pantoprazole 40 MG tablet Commonly known as:  PROTONIX Take 1 tablet (40 mg total) by mouth daily.   thiamine 100 MG tablet Commonly known as:  VITAMIN B-1 Take 1 tablet (100 mg total) by mouth daily.   traMADol 50 MG tablet Commonly known as:  ULTRAM Take 1 tablet (50 mg total) by mouth every 12 (twelve) hours as needed for moderate pain.   Vitamin B 12 100 MCG Lozg Take 1 by mouth daily       Vitals:   01/18/17 1200 01/18/17 1217  BP: 132/85 (!) 141/81  Pulse: 83 85  Resp: 18 18  Temp:  97.7 F (36.5 C)  SpO2:  95%    Skin clean, dry and intact without evidence of skin break down, no evidence of skin tears noted. IV catheter discontinued intact. Site without signs and symptoms of complications. Dressing and pressure applied. HD catheter intact without complications. Pt denies pain at this time. No complaints noted.  An After Visit Summary was printed and given to the patient. Patient escorted via WC, and D/C to SNF via [facility].Chuck Hint RN [place] [facility] [employer]

## 2017-01-18 NOTE — Discharge Summary (Addendum)
Physician Discharge Summary  Philip West WER:[id number] DOB: Dec 22, 1970 DOA: 01/11/2017  PCP: Philip Barrack, MD  Admit date: 01/11/2017 Discharge date: 01/18/2017  Time spent: 45 minutes  Recommendations for Outpatient Follow-up:  Patient will be discharged to [facility] skilled nursing facility.  Continue physical and occupational therapy. Patient will need to follow up with primary care provider within one week of discharge.  Continue hemodialysis as scheduled. Patient should continue medications as prescribed.  Patient should follow a Renal/carb modified diet.   Discharge Diagnoses:  Anoxic brain injury ESRD/ Dialysis access malfunction Frequent falls Hypoxia Essential hypertension Chronic diarrhea Diabetes mellitus, type II Possible dysphagia  Discharge Condition: stable  Diet recommendation: Renal/carb modified  Filed Weights   01/18/17 0514 01/18/17 0800 01/18/17 1217  Weight: 66.7 kg (147 lb 0.8 oz) 63.9 kg (140 lb 14 oz) 61.9 kg (136 lb 7.4 oz)    History of present illness:  on 01/12/2017 by Dr. Dia West Philip West a 46 y.o.malemoved here from [place] several months ago PMHxESRD on HDM,/W/F, frequent fallsDM Type 2,Essential HTN,PancreaticTumor. Anoxic brain injury (found down [place]CPOAin [place] in February), Per patient has had diarrhea for 10 years (chronic). Negative CP, negative SOB, negative abdominal pain, negative N/V. States does not use home O2. Patient stated approximate 2 weeks ago had rash on his torso arms pustular in nature, extremely itchy.  Hospital Course: Anoxic brain injury -Appears to be at baseline and answering questions appropriately -Patient did require medicines however currently does not have that in place -Vascular surgery consulted and appreciated as patient pulled out his vascular cath in the emergency department -Continues to have mildly prolonged QTC, continue to hold Zyprexa  ESRD/ Dialysis  access malfunction -Nephrology consulted and appreicated -dialyzes MWF -As above  Frequent falls -Likely secondary to the above and deconditioning -PT/OT rec SNF  Hypoxia -occurs with sleeping -likely OSA, needs outpatient sleep study  Essential hypertension -Currently not on any medications, stable  Chronic diarrhea -Continue home meds, increased  Diabetes mellitus, type II -Continue insulin sliding scale  Possible dysphagia -While working with OT, patient had an intense coughing spell after sipping orange juice through a straw -Speech consulted- recommended regular diet, thin liquid, with intermittent supervision to cue for compensatory strategies (slow rate, small sips/bites)  Consultants Nephrology Vascular surgery, ED  Procedures  none  Discharge Exam: Vitals:   01/18/17 1200 01/18/17 1217  BP: 132/85 (!) 141/81  Pulse: 83 85  Resp: 18 18  Temp:  97.7 F (36.5 C)  SpO2:  95%   Patient seen in dialysis. Only complained of not eating.  Denies chest pain, shortness of breath, abdominal pain, N/V/D/C.   General: Well developed, chronically ill appearing, NAD  HEENT: NCAT, mucous membranes moist.  Cardiovascular: S1 S2 auscultated, RRR, no murmur  Respiratory: Clear to auscultation bilaterally with equal chest rise  Abdomen: Soft, nontender, nondistended, + bowel sounds  Extremities: warm dry without cyanosis clubbing or edema  Neuro: AAOx2 (self, place, not situation), nonfocal  Psych: Appropriate  Discharge Instructions Discharge Instructions    Discharge instructions   Complete by:  As directed    Patient will be discharged to [facility] skilled nursing facility.  Continue physical and occupational therapy. Patient will need to follow up with primary care provider within one week of discharge.  Continue hemodialysis as scheduled. Patient should continue medications as prescribed.  Patient should follow a Renal/carb modified diet.      Allergies as of 01/18/2017   No Known Allergies  Medication List    STOP taking these medications   insulin lispro 100 UNIT/ML injection Commonly known as:  HUMALOG     TAKE these medications   aspirin 81 MG EC tablet Take 1 tablet (81 mg total) by mouth daily.   b complex-vitamin c-folic acid 0.8 MG Tabs tablet Take 1 tablet by mouth daily.   camphor-menthol lotion Commonly known as:  SARNA Apply topically as needed for itching.   cholestyramine 4 g packet Commonly known as:  QUESTRAN Take 1 packet (4 g total) by mouth 2 (two) times daily.   feeding supplement (NEPRO CARB STEADY) Liqd Take 237 mLs by mouth 2 (two) times daily between meals.   folic acid-pyridoxine-cyancobalamin 2.5-25-2 MG Tabs tablet Commonly known as:  FOLTX Take 1 tablet by mouth daily.   hydrocerin Crea Apply 1 application topically 2 (two) times daily.   insulin aspart 100 UNIT/ML injection Commonly known as:  novoLOG Inject 0-9 Units into the skin 3 (three) times daily with meals. CBG 70 - 120: 0 units CBG 121 - 150: 1 unit CBG 151 - 200: 2 units CBG 201 - 250: 3 units CBG 251 - 300: 5 units CBG 301 - 350: 7 units CBG 351 - 400: 9 units   insulin detemir 100 UNIT/ML injection Commonly known as:  LEVEMIR Inject 0.1-0.15 mLs (10-15 Units total) into the skin 2 (two) times daily. 15 units at 6 am and 10 units at 6 pm   lipase/protease/amylase 12000 units Cpep capsule Commonly known as:  CREON Take 2 capsules (24,000 Units total) by mouth 3 (three) times daily with meals. What changed:  Another medication with the same name was removed. Continue taking this medication, and follow the directions you see here.   loperamide 2 MG tablet Commonly known as:  IMODIUM A-D Take 1 tablet (2 mg total) by mouth 4 (four) times daily as needed for diarrhea or loose stools.   mupirocin ointment 2 % Commonly known as:  BACTROBAN Place 1 application into the nose 2 (two) times daily for 4 days.    niacin 500 MG tablet Take 1 tablet (500 mg total) by mouth at bedtime.   pantoprazole 40 MG tablet Commonly known as:  PROTONIX Take 1 tablet (40 mg total) by mouth daily.   thiamine 100 MG tablet Commonly known as:  VITAMIN B-1 Take 1 tablet (100 mg total) by mouth daily.   traMADol 50 MG tablet Commonly known as:  ULTRAM Take 1 tablet (50 mg total) by mouth every 12 (twelve) hours as needed for moderate pain.   Vitamin B 12 100 MCG Lozg Take 1 by mouth daily      No Known Allergies  Contact information for follow-up providers    Philip Barrack, MD. Schedule an appointment as soon as possible for a visit.   Specialty:  Family Medicine Why:  Schedule follow-up appointment with Dr. [name], Hetty Blend in 2-3 days anoxic brain injury, frequent falls. Patient requires placement in Neuro SNF  Contact information: [place] [address] [phone]            Contact information for after-discharge care    Destination    [facility] .   Service:  Skilled Nursing Contact information: [address] [place] [phone]                   The results of significant diagnostics from this hospitalization (including imaging, microbiology, ancillary and laboratory)  are listed below for reference.    Significant Diagnostic Studies: Ct Abdomen Pelvis Wo Contrast  Result Date: 01/02/2017 CLINICAL DATA:  Newly diagnosed VIPoma. Chronic diarrhea. Recent onset renal failure. EXAM: CT ABDOMEN AND PELVIS WITHOUT CONTRAST TECHNIQUE: Multidetector CT imaging of the abdomen and pelvis was performed following the standard protocol without IV contrast. COMPARISON:  Radiographs from 11/27/2016 FINDINGS: Lower chest: Moderate cardiomegaly. Dialysis catheter tip: Right atrium. Moderate right pleural effusion with passive atelectasis. Trace pericardial effusion posteriorly. Hepatobiliary: Suspected mild periportal edema.  Indistinct gallbladder wall possibly from adjacent fluid. Pancreas: A discrete mass lesion is not appreciable on today's noncontrast CT to correlate with the provided history of VIPoma, although these tumors can be small and negative predictive value of today's noncontrast CT may be below. Spleen: Unremarkable Adrenals/Urinary Tract: Mild diffuse bladder wall thickening slightly out of proportion to the degree of nondistention. Vascular calcifications in both renal hila without appreciable renal calculi. No hydronephrosis. Bilateral perirenal stranding appears symmetric. Currently renal parenchymal thickness is not reduced. Adrenal glands normal. Stomach/Bowel: Mild wall thickening in the jejunum with slight irregularity of the jejunal falls. Orally administered contrast extends through to the colon. Appendix appears mildly thickened at 9 mm diameter on image 66/2. The distal rectal wall appears thickened. No dilated bowel. Vascular/Lymphatic: Considerable atherosclerotic calcification of mesenteric vasculature with only mild atherosclerotic calcification of the aortoiliac tree, pattern favoring diabetes associated atherosclerosis. Scattered primarily small retroperitoneal and pelvic lymph nodes are not pathologically enlarged. Reproductive: Vas deferens calcifications along with vascular calcifications in the penis and scrotum. Other: Mild ascites most notable in the perihepatic region and pelvis. Diffuse mesenteric and omental edema with low-grade edema in the subcutaneous tissues. Musculoskeletal: Healed left seventh rib posterolaterally likely related to prior fracture. Cortical lucency with adjacent sclerosis anteriorly in the left sixth rib on image 8/2, conceivably also from a prior rib fracture but relatively nonspecific. There is degenerative loss of articular space in both hips. IMPRESSION: 1. No pancreatic mass is identified on today's exam to correlate with the history of the VIPoma, although negative  predictive value is reduced by the lack of IV contrast. 2. Suspected third spacing of fluid with diffuse mesenteric and omental edema along with mild diffuse subcutaneous edema. Small amount of pelvic and perihepatic ascites. 3. Moderate cardiomegaly with moderate right pleural effusion with associated passive atelectasis. 4. Mild periportal edema, nonspecific. 5. Bladder wall thickening slightly out of proportion to the degree of nondistention, cystitis is not excluded. 6. Mild wall thickening in portions of the bowel including the jejunum and rectum, nonspecific. Some of this may be due to the third spacing of fluid. Proximal enteritis not excluded. 7. Extensive atherosclerosis in a pattern favoring diabetic association. 8. Healed left seventh rib fracture ; sclerosis and adjacent cortical lucency in the left sixth rib anterolaterally is less specific but may represent a healing fracture or stress injury. An enchondroma can have a similar appearance although the sclerosis would be unusual. Electronically Signed   By: Van Clines M.D.   On: 01/02/2017 09:11   Ct Head Wo Contrast  Result Date: 01/12/2017 CLINICAL DATA:  Altered level of consciousness. Dizziness leading to fall. EXAM: CT HEAD WITHOUT CONTRAST TECHNIQUE: Contiguous axial images were obtained from the base of the skull through the vertex without intravenous contrast. COMPARISON:  None. FINDINGS: Brain: Mild generalized atrophy for age. No intracranial hemorrhage, mass effect, or midline shift. No hydrocephalus. The basilar cisterns are patent. No evidence of territorial infarct or acute ischemia. No extra-axial  or intracranial fluid collection. Vascular: Atherosclerosis of skullbase vasculature without hyperdense vessel or abnormal calcification. Skull: No fracture or focal lesion. Sinuses/Orbits: Paranasal sinuses and mastoid air cells are clear. The visualized orbits are unremarkable. Probable cataract resection on the left. Other:  Subcutaneous calcifications suggest diabetes. IMPRESSION: No acute intracranial abnormality. Electronically Signed   By: Jeb Levering M.D.   On: 01/12/2017 00:34   Dg Chest Port 1 View  Result Date: 01/13/2017 CLINICAL DATA:  46 year old male status post dialysis catheter insertion. EXAM: PORTABLE CHEST 1 VIEW COMPARISON:  01/12/2017 FINDINGS: Right-sided dialysis catheter terminates in the right atrium. There is stable cardiomegaly and pulmonary vascular congestion. No focal parenchymal consolidation, sizable effusion or pneumothorax. No acute osseous abnormalities. IMPRESSION: Right-sided dialysis catheter terminating in the right atrium. Otherwise unchanged radiographic examination of the chest. Electronically Signed   By: Kristopher Oppenheim M.D.   On: 01/13/2017 11:28   Dg Abd Acute W/chest  Result Date: 01/12/2017 CLINICAL DATA:  Vomiting. EXAM: DG ABDOMEN ACUTE W/ 1V CHEST COMPARISON:  CT 01/01/2017, radiograph 11/27/2016 FINDINGS: Right-sided dialysis catheter remains in place. Elevated right hemidiaphragm with right pleural effusion. There is cardiomegaly and vascular congestion. Increased air throughout small and large bowel in a nonobstructive pattern appears similar to prior exam. No free air. Small colonic stool burden. Extensive vascular calcifications. The bones are under mineralized. IMPRESSION: 1. Cardiomegaly with right pleural effusion and vascular congestion. 2. No bowel obstruction or free air. Increased air throughout nondilated bowel is similar to prior exams, enteritis could have this appearance. Electronically Signed   By: Jeb Levering M.D.   On: 01/12/2017 00:48   Dg Fluoro Guide Cv Line-no Report  Result Date: 01/13/2017 Fluoroscopy was utilized by the requesting physician.  No radiographic interpretation.   Dg Hip Unilat W Or Wo Pelvis 2-3 Views Right  Result Date: 01/08/2017 CLINICAL DATA:  [place] last night with right hip pain EXAM: DG HIP (WITH OR WITHOUT PELVIS)  2-3V RIGHT COMPARISON:  CT abdomen pelvis of 01/01/2017 FINDINGS: No acute fracture is seen. There is mild degenerative joint disease of both hips, left-greater-than-right. The pelvic rami are intact. The SI joints appear corticated. Considerable arterial calcification is present diffusely. IMPRESSION: 1. No acute fracture. 2. Degenerative change in the hips left-greater-than-right. Diffuse arterial calcifications. Electronically Signed   By: Ivar Drape M.D.   On: 01/08/2017 08:47    Microbiology: Recent Results (from the past 240 hour(s))  MRSA PCR Screening     Status: Abnormal   Collection Time: 01/14/17 11:34 PM  Result Value Ref Range Status   MRSA by PCR POSITIVE (A) NEGATIVE Final    Comment:        The GeneXpert MRSA Assay (FDA approved for NASAL specimens only), is one component of a comprehensive MRSA colonization surveillance program. It is not intended to diagnose MRSA infection nor to guide or monitor treatment for MRSA infections. RESULT CALLED TO, READ BACK BY AND VERIFIED WITH: E CASTRO RN 0330 01/15/17 A BROWNING      Labs: Basic Metabolic Panel: Recent Labs  Lab 01/13/17 0654 01/13/17 1808 01/14/17 0218 01/15/17 0957 01/16/17 1113 01/18/17 0654  NA 132*  --  133* 131* 133* 131*  K 4.8  --  5.8* 5.3* 3.8 3.9  CL 98*  --  98* 97* 97* 101  CO2 21*  --  19* 18* 24 18*  GLUCOSE 99  --  83 77 184* 119*  BUN 40*  --  50* 74* 41* 37*  CREATININE 6.47*  7.22* 7.94* 10.04* 6.98* 7.35*  CALCIUM 8.1*  --  7.9* 7.6* 7.5* 7.6*  MG  --   --  1.9  --   --   --   PHOS 6.1*  --   --  8.7* 5.8*  --    Liver Function Tests: Recent Labs  Lab 01/13/17 0654 01/14/17 0218 01/15/17 0957 01/16/17 1113  AST  --  20  --   --   ALT  --  21  --   --   ALKPHOS  --  337*  --   --   BILITOT  --  1.7*  --   --   PROT  --  6.1*  --   --   ALBUMIN 2.5* 2.6* 2.4* 2.3*   No results for input(s): LIPASE, AMYLASE in the last 168 hours. No results for input(s): AMMONIA in the  last 168 hours. CBC: Recent Labs  Lab 01/13/17 1808 01/14/17 0218 01/15/17 0955 01/16/17 1113 01/18/17 0654  WBC 10.7* 8.0 8.8 8.2 8.1  HGB 11.3* 10.6* 10.5* 10.5* 10.3*  HCT 35.3* 35.2* 33.4* 33.4* 32.6*  MCV 85.1 86.1 82.9 83.7 84.9  PLT 291 249 264 251 233   Cardiac Enzymes: No results for input(s): CKTOTAL, CKMB, CKMBINDEX, TROPONINI in the last 168 hours. BNP: BNP (last 3 results) No results for input(s): BNP in the last 8760 hours.  ProBNP (last 3 results) No results for input(s): PROBNP in the last 8760 hours.  CBG: Recent Labs  Lab 01/17/17 1657 01/17/17 2157 01/18/17 0723 01/18/17 1236 01/18/17 1704  GLUCAP 96 121* 113* 105* 157*       Signed:  Adalin Vanderploeg  [facility] 01/18/2017, 6:50 PM

## 2017-01-18 NOTE — Procedures (Signed)
A bit agitated, may need to give some Ativan to keep him still for HD.    Kelly Splinter MD [employer] pgr [phone]   01/18/2017, 1:02 PM

## 2017-01-21 ENCOUNTER — Encounter: Payer: Self-pay | Admitting: Adult Health

## 2017-01-21 ENCOUNTER — Non-Acute Institutional Stay (SKILLED_NURSING_FACILITY): Payer: Medicare Other | Admitting: Adult Health

## 2017-01-21 DIAGNOSIS — K529 Noninfective gastroenteritis and colitis, unspecified: Secondary | ICD-10-CM

## 2017-01-21 DIAGNOSIS — R531 Weakness: Secondary | ICD-10-CM

## 2017-01-21 DIAGNOSIS — G931 Anoxic brain damage, not elsewhere classified: Secondary | ICD-10-CM | POA: Diagnosis not present

## 2017-01-21 DIAGNOSIS — E1122 Type 2 diabetes mellitus with diabetic chronic kidney disease: Secondary | ICD-10-CM | POA: Diagnosis not present

## 2017-01-21 DIAGNOSIS — Z992 Dependence on renal dialysis: Secondary | ICD-10-CM | POA: Diagnosis not present

## 2017-01-21 DIAGNOSIS — N186 End stage renal disease: Secondary | ICD-10-CM

## 2017-01-21 DIAGNOSIS — R0902 Hypoxemia: Secondary | ICD-10-CM | POA: Diagnosis not present

## 2017-01-21 NOTE — Progress Notes (Addendum)
Location:  [facility] Room Number: 108-A Place of Service:  SNF (31) Provider:  Durenda Age, NP  Patient Care Team: Vivi Barrack, MD as PCP - General (Family Medicine)  Extended Emergency Contact Information Primary Emergency Contact: [place] of [place] Phone: [phone] Work Phone: [phone] Relation: Sister Secondary Emergency Contact: [place] Mobile Phone: [phone] Relation: Significant other  Code Status:  Full Code Goals of care: Advanced Directive information Advanced Directives 01/11/2017  Does Patient Have a Medical Advance Directive? Yes  Type of Paramedic of [place];Living will  Would patient like information on creating a medical advance directive? -     Chief Complaint  Patient presents with  . Acute Visit    Followup on hospitalization at [facility] 11/23-11/30/18     HPI:  Pt is a 46 y.o. male seen today for followup of hospitalization at [facility] 11/23-11/30/18 for anoxic brain injury.  He was admitted to [facility] on 01/18/17. He was brought to the hospital due to declining status. He has been living with his sister. He used to live in [place] but was found down by [facility] in February. Sister reports that he has progressively declined and she can no longer take care of him. He has had multiple falls at home. He is a dialysis patient and while in the ED, he pulled out his tunneled dialysis catheter. Vascular was consulted and replaced catheter, right IJ, on 01/13/17. He has chronic diarrhea for 10 years. He has a PMH of ESRD on hemodialysis M/W/F, frequent walls, type 2 DM, essential HTN, and pancreatic tumor. He was seen in the room today.     Past Medical History:  Diagnosis Date  . Allergy   . Anoxic brain injury ([place])   . Diabetes mellitus without complication ([place])   . Hyperlipidemia   . Hypertension   . Kidney failure   .  Pancreatic tumor   . Picker's nodule   . 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 EYE SURGERY Left   . INSERTION OF DIALYSIS CATHETER Right 01/13/2017   Procedure: INSERTION OF DIALYSIS CATHETER - Right Internal Jugular Placement;  Surgeon: Angelia Mould, MD;  Location: [facility] OR;  Service: Vascular;  Laterality: Right;    No Known Allergies  Outpatient Encounter Medications as of 01/21/2017  Medication Sig  . aspirin EC 81 MG EC tablet Take 1 tablet (81 mg total) by mouth daily.  Marland Kitchen b complex-vitamin c-folic acid (NEPHRO-VITE) 0.8 MG TABS tablet Take 1 tablet by mouth daily.  . camphor-menthol (SARNA) lotion Apply topically as needed for itching.  . cholestyramine (QUESTRAN) 4 g packet Take 1 packet (4 g total) by mouth 2 (two) times daily.  . Cyanocobalamin (VITAMIN B 12) 100 MCG LOZG Take 1 by mouth daily  . folic acid-pyridoxine-cyancobalamin (FOLTX) 2.5-25-2 MG TABS tablet Take 1 tablet by mouth daily.  . hydrocerin (EUCERIN) CREA Apply 1 application topically 2 (two) times daily.  . insulin aspart (NOVOLOG) 100 UNIT/ML injection Inject 0-9 Units into the skin 3 (three) times daily with meals. CBG 70 - 120: 0 units CBG 121 - 150: 1 unit CBG 151 - 200: 2 units CBG 201 - 250: 3 units CBG 251 - 300: 5 units CBG 301 - 350: 7 units CBG 351 - 400: 9 units  . insulin detemir (LEVEMIR) 100 UNIT/ML injection Inject 0.1-0.15 mLs (10-15 Units total) into the skin 2 (two) times daily. 15  units at 6 am and 10 units at 6 pm  . lipase/protease/amylase (CREON) 12000 units CPEP capsule Take 2 capsules (24,000 Units total) by mouth 3 (three) times daily with meals.  Marland Kitchen loperamide (IMODIUM A-D) 2 MG tablet Take 1 tablet (2 mg total) by mouth 4 (four) times daily as needed for diarrhea or loose stools.  . mupirocin ointment (BACTROBAN) 2 % Place 1 application into the nose 2 (two) times daily for 4 days.  . niacin 500 MG tablet Take 1 tablet (500 mg total) by mouth at  bedtime.  . Nutritional Supplements (FEEDING SUPPLEMENT, NEPRO CARB STEADY,) LIQD Take 237 mLs by mouth 2 (two) times daily between meals.  . pantoprazole (PROTONIX) 40 MG tablet Take 1 tablet (40 mg total) by mouth daily.  Marland Kitchen thiamine (VITAMIN B-1) 100 MG tablet Take 1 tablet (100 mg total) by mouth daily.  . traMADol (ULTRAM) 50 MG tablet Take 1 tablet (50 mg total) by mouth every 12 (twelve) hours as needed for moderate pain.   No facility-administered encounter medications on file as of 01/21/2017.     Review of Systems  GENERAL: No change in appetite, no fatigue, no weight changes, no fever, chills or weakness MOUTH and THROAT: Denies oral discomfort, gingival pain  RESPIRATORY: no cough, SOB, DOE, wheezing, hemoptysis CARDIAC: No chest pain, edema or palpitations GI: No abdominal pain, diarrhea, constipation, heart burn, nausea or vomiting GU: Denies discharge PSYCHIATRIC: Denies feelings of depression or anxiety. No report of hallucinations, insomnia, paranoia, or agitation   Immunization History  Administered Date(s) Administered  . Influenza-Unspecified 11/19/2016  . Pneumococcal-Unspecified 11/19/2016   Pertinent  Health Maintenance Due  Topic Date Due  . OPHTHALMOLOGY EXAM  02/07/1980  . INFLUENZA VACCINE  01/03/2027 (Originally 09/19/2016)  . HEMOGLOBIN A1C  06/11/2017  . FOOT EXAM  01/02/2018   No flowsheet data found. Functional Status Survey:    Vitals:   01/21/17 1544  BP: 126/67  Pulse: 72  Resp: 20  Temp: 98.6 F (37 C)  SpO2: 95%  Weight: 136 lb 7.5 oz (61.9 kg)  Height: 6' (1.829 m)   Body mass index is 18.51 kg/m. Physical Exam  GENERAL APPEARANCE: In no acute distress.  SKIN:  Multiple scabs and scars on his back and extremities MOUTH and THROAT: Lips are without lesions. Oral mucosa is moist and without lesions.  RESPIRATORY: Breathing is even & unlabored, BS CTAB CARDIAC: RRR, no murmur,no extra heart sounds, no edema GI: Abdomen soft,  normal BS, no masses, no tenderness EXTREMITIES:  Able to move 4 extremities PSYCHIATRIC: Alert to self and time, disoriented to place. Affect and behavior are appropriate   Labs reviewed: Recent Labs    01/13/17 0654  01/14/17 0218 01/15/17 0957 01/16/17 1113 01/18/17 0654  NA 132*  --  133* 131* 133* 131*  K 4.8  --  5.8* 5.3* 3.8 3.9  CL 98*  --  98* 97* 97* 101  CO2 21*  --  19* 18* 24 18*  GLUCOSE 99  --  83 77 184* 119*  BUN 40*  --  50* 74* 41* 37*  CREATININE 6.47*   < > 7.94* 10.04* 6.98* 7.35*  CALCIUM 8.1*  --  7.9* 7.6* 7.5* 7.6*  MG  --   --  1.9  --   --   --   PHOS 6.1*  --   --  8.7* 5.8*  --    < > = values in this interval not displayed.   Recent  Labs    12/11/16 1208 01/11/17 1830  01/14/17 0218 01/15/17 0957 01/16/17 1113  AST 17 41  --  20  --   --   ALT 19 44  --  21  --   --   ALKPHOS 309* 568*  --  337*  --   --   BILITOT 0.7 1.2  --  1.7*  --   --   PROT 7.0 6.9  --  6.1*  --   --   ALBUMIN 3.5 2.9*   < > 2.6* 2.4* 2.3*   < > = values in this interval not displayed.   Recent Labs    11/27/16 2141  01/15/17 0955 01/16/17 1113 01/18/17 0654  WBC 7.5   < > 8.8 8.2 8.1  NEUTROABS 4.8  --   --   --   --   HGB 10.6*   < > 10.5* 10.5* 10.3*  HCT 32.5*   < > 33.4* 33.4* 32.6*  MCV 81.9   < > 82.9 83.7 84.9  PLT 207   < > 264 251 233   < > = values in this interval not displayed.   No results found for: TSH Lab Results  Component Value Date   HGBA1C 8.3 (H) 12/11/2016   Lab Results  Component Value Date   CHOL 106 12/11/2016   HDL 47.80 12/11/2016   LDLCALC 45 12/11/2016   TRIG 63.0 12/11/2016   CHOLHDL 2 12/11/2016    Significant Diagnostic Results in last 30 days:  Ct Abdomen Pelvis Wo Contrast  Result Date: 01/02/2017 CLINICAL DATA:  Newly diagnosed VIPoma. Chronic diarrhea. Recent onset renal failure. EXAM: CT ABDOMEN AND PELVIS WITHOUT CONTRAST TECHNIQUE: Multidetector CT imaging of the abdomen and pelvis was performed  following the standard protocol without IV contrast. COMPARISON:  Radiographs from 11/27/2016 FINDINGS: Lower chest: Moderate cardiomegaly. Dialysis catheter tip: Right atrium. Moderate right pleural effusion with passive atelectasis. Trace pericardial effusion posteriorly. Hepatobiliary: Suspected mild periportal edema. Indistinct gallbladder wall possibly from adjacent fluid. Pancreas: A discrete mass lesion is not appreciable on today's noncontrast CT to correlate with the provided history of VIPoma, although these tumors can be small and negative predictive value of today's noncontrast CT may be below. Spleen: Unremarkable Adrenals/Urinary Tract: Mild diffuse bladder wall thickening slightly out of proportion to the degree of nondistention. Vascular calcifications in both renal hila without appreciable renal calculi. No hydronephrosis. Bilateral perirenal stranding appears symmetric. Currently renal parenchymal thickness is not reduced. Adrenal glands normal. Stomach/Bowel: Mild wall thickening in the jejunum with slight irregularity of the jejunal falls. Orally administered contrast extends through to the colon. Appendix appears mildly thickened at 9 mm diameter on image 66/2. The distal rectal wall appears thickened. No dilated bowel. Vascular/Lymphatic: Considerable atherosclerotic calcification of mesenteric vasculature with only mild atherosclerotic calcification of the aortoiliac tree, pattern favoring diabetes associated atherosclerosis. Scattered primarily small retroperitoneal and pelvic lymph nodes are not pathologically enlarged. Reproductive: Vas deferens calcifications along with vascular calcifications in the penis and scrotum. Other: Mild ascites most notable in the perihepatic region and pelvis. Diffuse mesenteric and omental edema with low-grade edema in the subcutaneous tissues. Musculoskeletal: Healed left seventh rib posterolaterally likely related to prior fracture. Cortical lucency with  adjacent sclerosis anteriorly in the left sixth rib on image 8/2, conceivably also from a prior rib fracture but relatively nonspecific. There is degenerative loss of articular space in both hips. IMPRESSION: 1. No pancreatic mass is identified on today's exam to correlate with the  history of the VIPoma, although negative predictive value is reduced by the lack of IV contrast. 2. Suspected third spacing of fluid with diffuse mesenteric and omental edema along with mild diffuse subcutaneous edema. Small amount of pelvic and perihepatic ascites. 3. Moderate cardiomegaly with moderate right pleural effusion with associated passive atelectasis. 4. Mild periportal edema, nonspecific. 5. Bladder wall thickening slightly out of proportion to the degree of nondistention, cystitis is not excluded. 6. Mild wall thickening in portions of the bowel including the jejunum and rectum, nonspecific. Some of this may be due to the third spacing of fluid. Proximal enteritis not excluded. 7. Extensive atherosclerosis in a pattern favoring diabetic association. 8. Healed left seventh rib fracture ; sclerosis and adjacent cortical lucency in the left sixth rib anterolaterally is less specific but may represent a healing fracture or stress injury. An enchondroma can have a similar appearance although the sclerosis would be unusual. Electronically Signed   By: Van Clines M.D.   On: 01/02/2017 09:11   Ct Head Wo Contrast  Result Date: 01/12/2017 CLINICAL DATA:  Altered level of consciousness. Dizziness leading to fall. EXAM: CT HEAD WITHOUT CONTRAST TECHNIQUE: Contiguous axial images were obtained from the base of the skull through the vertex without intravenous contrast. COMPARISON:  None. FINDINGS: Brain: Mild generalized atrophy for age. No intracranial hemorrhage, mass effect, or midline shift. No hydrocephalus. The basilar cisterns are patent. No evidence of territorial infarct or acute ischemia. No extra-axial or  intracranial fluid collection. Vascular: Atherosclerosis of skullbase vasculature without hyperdense vessel or abnormal calcification. Skull: No fracture or focal lesion. Sinuses/Orbits: Paranasal sinuses and mastoid air cells are clear. The visualized orbits are unremarkable. Probable cataract resection on the left. Other: Subcutaneous calcifications suggest diabetes. IMPRESSION: No acute intracranial abnormality. Electronically Signed   By: Jeb Levering M.D.   On: 01/12/2017 00:34   Dg Chest Port 1 View  Result Date: 01/13/2017 CLINICAL DATA:  46 year old male status post dialysis catheter insertion. EXAM: PORTABLE CHEST 1 VIEW COMPARISON:  01/12/2017 FINDINGS: Right-sided dialysis catheter terminates in the right atrium. There is stable cardiomegaly and pulmonary vascular congestion. No focal parenchymal consolidation, sizable effusion or pneumothorax. No acute osseous abnormalities. IMPRESSION: Right-sided dialysis catheter terminating in the right atrium. Otherwise unchanged radiographic examination of the chest. Electronically Signed   By: Kristopher Oppenheim M.D.   On: 01/13/2017 11:28   Dg Abd Acute W/chest  Result Date: 01/12/2017 CLINICAL DATA:  Vomiting. EXAM: DG ABDOMEN ACUTE W/ 1V CHEST COMPARISON:  CT 01/01/2017, radiograph 11/27/2016 FINDINGS: Right-sided dialysis catheter remains in place. Elevated right hemidiaphragm with right pleural effusion. There is cardiomegaly and vascular congestion. Increased air throughout small and large bowel in a nonobstructive pattern appears similar to prior exam. No free air. Small colonic stool burden. Extensive vascular calcifications. The bones are under mineralized. IMPRESSION: 1. Cardiomegaly with right pleural effusion and vascular congestion. 2. No bowel obstruction or free air. Increased air throughout nondilated bowel is similar to prior exams, enteritis could have this appearance. Electronically Signed   By: Jeb Levering M.D.   On: 01/12/2017  00:48   Dg Fluoro Guide Cv Line-no Report  Result Date: 01/13/2017 Fluoroscopy was utilized by the requesting physician.  No radiographic interpretation.   Dg Hip Unilat W Or Wo Pelvis 2-3 Views Right  Result Date: 01/08/2017 CLINICAL DATA:  [place] last night with right hip pain EXAM: DG HIP (WITH OR WITHOUT PELVIS) 2-3V RIGHT COMPARISON:  CT abdomen pelvis of 01/01/2017 FINDINGS: No acute fracture is  seen. There is mild degenerative joint disease of both hips, left-greater-than-right. The pelvic rami are intact. The SI joints appear corticated. Considerable arterial calcification is present diffusely. IMPRESSION: 1. No acute fracture. 2. Degenerative change in the hips left-greater-than-right. Diffuse arterial calcifications. Electronically Signed   By: Ivar Drape M.D.   On: 01/08/2017 08:47    Assessment/Plan  1. Generalized weakness - for rehabilitation, PT and OT, for therapeutic and strengthening exercises, fall precautions   2. Anoxic brain injury ([place]) - appears to be at baseline, Zyprexa was held, fall precautions, start vitamin B complex 1 tab daily, vitamin B12 1000 g 1 tab daily and folic acid 1 mg 1 tab daily   3.  Chronic diarrhea - continue loperamide 2 mg 1 tab every 4 hours when necessary and Creon 24,000 units 3 times a day, check BMP in 1 week   4. End-stage renal disease on hemodialysis ([place]) - he pulled out his tunneled dialysis catheter while in the ED and was replaced by vascular, continue hemodialysis 3 times/week   5. Diabetes mellitus with ESRD (end-stage renal disease) ([place]) - continue Levemir 100 units/mL inject 15 units subcutaneous every morning and 10 units subcutaneous every afternoon, NovoLog sliding scale subcutaneous 3 times a day Lab Results  Component Value Date   HGBA1C 8.3 (H) 12/11/2016    6. Hypoxia -  check O2 sat daily at bedtime 1 week      Family/ staff Communication: Discussed care plan with patient and discharged  Labs/tests  ordered:  BMP in 1 week    Goals of care:  Short-term rehabilitation    [facility] [phone]

## 2017-01-22 ENCOUNTER — Encounter: Payer: Self-pay | Admitting: Internal Medicine

## 2017-01-22 ENCOUNTER — Non-Acute Institutional Stay (SKILLED_NURSING_FACILITY): Payer: Medicare Other | Admitting: Internal Medicine

## 2017-01-22 DIAGNOSIS — G931 Anoxic brain damage, not elsewhere classified: Secondary | ICD-10-CM

## 2017-01-22 DIAGNOSIS — N186 End stage renal disease: Secondary | ICD-10-CM | POA: Diagnosis not present

## 2017-01-22 DIAGNOSIS — C254 Malignant neoplasm of endocrine pancreas: Secondary | ICD-10-CM

## 2017-01-22 DIAGNOSIS — Z992 Dependence on renal dialysis: Secondary | ICD-10-CM

## 2017-01-22 DIAGNOSIS — R229 Localized swelling, mass and lump, unspecified: Secondary | ICD-10-CM

## 2017-01-22 DIAGNOSIS — E1122 Type 2 diabetes mellitus with diabetic chronic kidney disease: Secondary | ICD-10-CM | POA: Diagnosis not present

## 2017-01-22 DIAGNOSIS — R627 Adult failure to thrive: Secondary | ICD-10-CM | POA: Insufficient documentation

## 2017-01-22 DIAGNOSIS — L281 Prurigo nodularis: Secondary | ICD-10-CM

## 2017-01-22 NOTE — Progress Notes (Signed)
NURSING HOME LOCATION:  [facility] ROOM NUMBER:  108-A  CODE STATUS:  Full Code  PCP:  Vivi Barrack, MD  [address] [place] [address]   This is a comprehensive admission note to [facility] performed on this date less than 30 days from date of admission. Included are preadmission medical/surgical history;reconciled medication list; family history; social history and comprehensive review of systems.  Corrections and additions to the records were documented . Comprehensive physical exam was also performed. Additionally a clinical summary was entered for each active diagnosis pertinent to this admission in the Problem List to enhance continuity of care.  HPI:   The patient was hospitalized 11/23-11/30/18 for anoxic brain injury in the setting of end-stage renal disease for which he receives dialysis. He was taken to the hospital for declining clinical status by his sister with whom he lives after moving from [place].  Due to the progressive decline in his clinical state his sister is no longer able to care for him. He has had multiple falls at home. In the ED he pulled out his dialysis catheter. Vascular was consulted and replaced the catheter on 11/25 in the right IJ. He was hospitalized for the generalized weakness for PT/OT. His anoxic brain injury was felt to be at baseline. Zyprexa was held. Discharge labs revealed glucose of 119, BUN 37, creatinine 7.35, and GFR of  9. He exhibited a normochromic, normocytic anemia with hemoglobin 10.3 hematocrit 32.6. The last A1c on record was 8.3% on 12/11/16. There is discordance between the sugars and A1c, most likely due to his end-stage renal disease. Here at the SNF his glucoses ranged from 113 fasting to 161 after lunch.  Past medical and surgical history: Includes chronic diarrhea for over a decade, type 2 diabetes, essential hypertension, atypical neurodermatitis and pancreatic tumor.  Social history: Nondrinker,  never smoked  Family history: Reviewed  Review of systems: Completion was limited  due to sequelae of anoxic brain injury. Initially was unable to give the year and became very frustrated repeatedly cursing. He finally did state that it was 2018. He complains of severe diffuse itching, occasional swelling of the lips,  chronic diarrhea as well as some drainage from the left eye. He also is requesting something for sleep.  Physical exam:  Pertinent or positive findings: He has a mustache and goatee. There is anisocoria; the left pupil is small and the right is larger and slightly asymmetric. There is some dried greenish drainage from the OS medially. He exhibits a stammering speech pattern. Second heart sound is increased. Pedal pulses are decreased. He has atrophic limbs. There is decreased strength to opposition in the lower extremities. He has extensive excoriations over all extremities  General appearance: no acute distress , increased work of breathing is present.   Lymphatic: No lymphadenopathy about the head, neck, axilla . Eyes: No significant conjunctival inflammation or lid edema is present. There is no scleral icterus. Ears:  External ear exam shows no significant lesions or deformities.   Nose:  External nasal examination shows no deformity or inflammation. Nasal mucosa are pink and moist without lesions ,exudates Oral exam: lips and gums are healthy appearing.There is no oropharyngeal erythema or exudate . Neck:  No thyromegaly, masses, tenderness noted.    Heart:  Normal rate and regular rhythm. S1  normal without gallop, murmur, click, rub  Lungs:Chest clear to auscultation without wheezes, rhonchi,rales , rubs. Abdomen:Bowel sounds are normal. Abdomen is soft and nontender with no  organomegaly, hernias,masses. GU: deferred  Extremities:  No cyanosis, clubbing,edema  Neurologic exam : Balance,Rhomberg,finger to nose testing could not be completed due to clinical state Skin: Warm  & dry w/o tenting.  See clinical summary under each active problem in the Problem List with associated updated therapeutic plan

## 2017-01-22 NOTE — Assessment & Plan Note (Signed)
Focus should be on the fasting glucose and then the highest pre-meal glucose . Sliding scale insulin is felt to be relatively contraindicated in SNF as per reference sources.

## 2017-01-22 NOTE — Assessment & Plan Note (Signed)
Continue present regimen with Creon and Imodium A-D

## 2017-01-22 NOTE — Assessment & Plan Note (Signed)
No change in present regimen ;psych consult at next SNF visit

## 2017-01-22 NOTE — Assessment & Plan Note (Signed)
Continue hemodialysis 3 days a week. 

## 2017-01-22 NOTE — Patient Instructions (Signed)
See assessment and plan under each diagnosis in the problem list and acutely for this visit 

## 2017-01-22 NOTE — Assessment & Plan Note (Addendum)
Nutrition consult PT/OT at [facility]

## 2017-01-22 NOTE — Assessment & Plan Note (Signed)
Discontinue niacin, Zyrtec at bedtime

## 2017-01-29 ENCOUNTER — Encounter: Payer: Self-pay | Admitting: Adult Health

## 2017-01-29 ENCOUNTER — Non-Acute Institutional Stay (SKILLED_NURSING_FACILITY): Payer: Medicare Other | Admitting: Adult Health

## 2017-01-29 DIAGNOSIS — K529 Noninfective gastroenteritis and colitis, unspecified: Secondary | ICD-10-CM | POA: Diagnosis not present

## 2017-01-29 DIAGNOSIS — N186 End stage renal disease: Secondary | ICD-10-CM | POA: Diagnosis not present

## 2017-01-29 DIAGNOSIS — E1122 Type 2 diabetes mellitus with diabetic chronic kidney disease: Secondary | ICD-10-CM

## 2017-01-29 DIAGNOSIS — Z5320 Procedure and treatment not carried out because of patient's decision for unspecified reasons: Secondary | ICD-10-CM | POA: Diagnosis not present

## 2017-01-29 LAB — BASIC METABOLIC PANEL
BUN: 66 — AB (ref 4–21)
Creatinine: 10.8 — AB (ref 0.6–1.3)
GLUCOSE: 156
POTASSIUM: 5.8 — AB (ref 3.4–5.3)
SODIUM: 139 (ref 137–147)

## 2017-01-29 NOTE — Progress Notes (Signed)
Location:  [place] Room Number: 108-A Place of Service:  SNF (31) Provider:  Durenda Age, NP  Patient Care Team: Vivi Barrack, MD as PCP - General (Family Medicine)  Extended Emergency Contact Information Primary Emergency Contact: Larene [place] of [place] Phone: [phone] Work Phone: [phone] Relation: Sister Secondary Emergency Contact: [place] Phone: [phone] Relation: Significant other  Code Status:  Full Code  Goals of care: Advanced Directive information Advanced Directives 01/11/2017  Does Patient Have a Medical Advance Directive? Yes  Type of Paramedic of [place];Living will  Would patient like information on creating a medical advance directive? -     Chief Complaint  Patient presents with  . Acute Visit    Diabetes medication management; patient refusing Levemir    HPI:  Pt is a 46 y.o. male seen today for an acute visit because he is refusing his Levemir.  He is a short-term rehabilitation resident at [facility].  He has a PMH of an anoxic brain injury, DM without complication, HLD, HTN, kidney failure, Picker's nodule, pancreatic tumor, and vipoma. He was seen in the room today. He admitted refusing his insulin injections. Explained the importance of the insulin. To take insulin injections. He agreed to take insulin injections. CNA reported that he has been having multiple diarrhea in a day.   Past Medical History:  Diagnosis Date  . Allergy   . Anoxic brain injury ([place])   . Diabetes mellitus without complication ([place])   . Hyperlipidemia   . Hypertension   . Kidney failure   . Pancreatic tumor   . Picker's nodule   . Vipoma [facility])    Past Surgical History:  Procedure Laterality Date  . EYE SURGERY Left   . INSERTION OF DIALYSIS CATHETER Right 01/13/2017   Procedure: INSERTION OF DIALYSIS CATHETER - Right Internal  Jugular Placement;  Surgeon: Angelia Mould, MD;  Location: [facility] OR;  Service: Vascular;  Laterality: Right;    No Known Allergies  Outpatient Encounter Medications as of 01/29/2017  Medication Sig  . aspirin EC 81 MG EC tablet Take 1 tablet (81 mg total) by mouth daily.  Marland Kitchen b complex-vitamin c-folic acid (NEPHRO-VITE) 0.8 MG TABS tablet Take 1 tablet by mouth daily.  . camphor-menthol (SARNA) lotion Apply topically as needed for itching.  . cetirizine (ZYRTEC) 10 MG tablet Take 10 mg by mouth at bedtime.  . cholestyramine (QUESTRAN) 4 g packet Take 1 packet (4 g total) by mouth 2 (two) times daily.  Marland Kitchen erythromycin ophthalmic ointment 1 application 4 (four) times daily. Apply 1 inch into lower eye sac x7 days  . folic acid-pyridoxine-cyancobalamin (FOLTX) 2.5-25-2 MG TABS tablet Take 1 tablet by mouth daily.  . hydrocerin (EUCERIN) CREA Apply 1 application topically 2 (two) times daily.  . insulin aspart (NOVOLOG) 100 UNIT/ML injection Inject 0-9 Units into the skin 3 (three) times daily with meals. CBG 70 - 120: 0 units CBG 121 - 150: 1 unit CBG 151 - 200: 2 units CBG 201 - 250: 3 units CBG 251 - 300: 5 units CBG 301 - 350: 7 units CBG 351 - 400: 9 units  . insulin detemir (LEVEMIR) 100 UNIT/ML injection Inject 0.1-0.15 mLs (10-15 Units total) into the skin 2 (two) times daily. 15 units at 6 am and 10 units at 6 pm  . lipase/protease/amylase (CREON) 12000 units CPEP capsule Take 2 capsules (24,000 Units total) by mouth 3 (three) times daily with meals.  Marland Kitchen  loperamide (IMODIUM A-D) 2 MG tablet Take 1 tablet (2 mg total) by mouth 4 (four) times daily as needed for diarrhea or loose stools.  . Nutritional Supplements (FEEDING SUPPLEMENT, NEPRO CARB STEADY,) LIQD Take 237 mLs by mouth 2 (two) times daily between meals.  . pantoprazole (PROTONIX) 40 MG tablet Take 1 tablet (40 mg total) by mouth daily.  Marland Kitchen thiamine (VITAMIN B-1) 100 MG tablet Take 1 tablet (100 mg total) by mouth daily.  .  traMADol (ULTRAM) 50 MG tablet Take 1 tablet (50 mg total) by mouth every 12 (twelve) hours as needed for moderate pain.  . vitamin B-12 (CYANOCOBALAMIN) 1000 MCG tablet Take 1,000 mcg by mouth daily.  . [DISCONTINUED] Cyanocobalamin (VITAMIN B 12) 100 MCG LOZG Take 1 by mouth daily  . [DISCONTINUED] niacin 500 MG tablet Take 1 tablet (500 mg total) by mouth at bedtime.   No facility-administered encounter medications on file as of 01/29/2017.     Review of Systems  GENERAL: No change in appetite, no fatigue, no weight changes, no fever, chills or weakness MOUTH and THROAT: Denies oral discomfort, gingival pain or bleeding, pain from teeth or hoarseness   RESPIRATORY: no cough, SOB, DOE, wheezing, hemoptysis CARDIAC: No chest pain, edema or palpitations GI: +diarrhea PSYCHIATRIC: Denies feelings of depression or anxiety. No report of hallucinations, insomnia, paranoia, or agitation   Immunization History  Administered Date(s) Administered  . Influenza-Unspecified 11/19/2016  . Pneumococcal-Unspecified 11/19/2016   Pertinent  Health Maintenance Due  Topic Date Due  . OPHTHALMOLOGY EXAM  02/07/1980  . HEMOGLOBIN A1C  06/11/2017  . FOOT EXAM  01/02/2018  . INFLUENZA VACCINE  Completed      Vitals:   01/29/17 1608  BP: 132/72  Pulse: 78  Resp: 20  Temp: 98.9 F (37.2 C)  TempSrc: Oral  SpO2: 95%  Weight: 136 lb 7.5 oz (61.9 kg)  Height: 6' (1.829 m)   Body mass index is 18.51 kg/m.  Physical Exam  GENERAL APPEARANCE: In no acute distress.  SKIN:  Skin is warm and dry. MOUTH and THROAT: Lips are without lesions. Oral mucosa is moist and without lesions.  RESPIRATORY: Breathing is even & unlabored, BS CTAB CARDIAC: RRR, no murmur,no extra heart sounds, no edema, right chest perma cath GI: Abdomen soft, normal BS, no masses, no tenderness, no hepatomegaly, no splenomegaly EXTREMITIES:  Able to move X 4 extremities PSYCHIATRIC: Alert to self and date, disoriented to  place. Affect and behavior are appropriate    Labs reviewed: Recent Labs    01/13/17 0654  01/14/17 0218 01/15/17 0957 01/16/17 1113 01/18/17 0654  NA 132*  --  133* 131* 133* 131*  K 4.8  --  5.8* 5.3* 3.8 3.9  CL 98*  --  98* 97* 97* 101  CO2 21*  --  19* 18* 24 18*  GLUCOSE 99  --  83 77 184* 119*  BUN 40*  --  50* 74* 41* 37*  CREATININE 6.47*   < > 7.94* 10.04* 6.98* 7.35*  CALCIUM 8.1*  --  7.9* 7.6* 7.5* 7.6*  MG  --   --  1.9  --   --   --   PHOS 6.1*  --   --  8.7* 5.8*  --    < > = values in this interval not displayed.   Recent Labs    12/11/16 1208 01/11/17 1830  01/14/17 0218 01/15/17 0957 01/16/17 1113  AST 17 41  --  20  --   --  ALT 19 44  --  21  --   --   ALKPHOS 309* 568*  --  337*  --   --   BILITOT 0.7 1.2  --  1.7*  --   --   PROT 7.0 6.9  --  6.1*  --   --   ALBUMIN 3.5 2.9*   < > 2.6* 2.4* 2.3*   < > = values in this interval not displayed.   Recent Labs    11/27/16 2141  01/15/17 0955 01/16/17 1113 01/18/17 0654  WBC 7.5   < > 8.8 8.2 8.1  NEUTROABS 4.8  --   --   --   --   HGB 10.6*   < > 10.5* 10.5* 10.3*  HCT 32.5*   < > 33.4* 33.4* 32.6*  MCV 81.9   < > 82.9 83.7 84.9  PLT 207   < > 264 251 233   < > = values in this interval not displayed.   No results found for: TSH Lab Results  Component Value Date   HGBA1C 8.3 (H) 12/11/2016   Lab Results  Component Value Date   CHOL 106 12/11/2016   HDL 47.80 12/11/2016   LDLCALC 45 12/11/2016   TRIG 63.0 12/11/2016   CHOLHDL 2 12/11/2016    Significant Diagnostic Results in last 30 days:  Ct Abdomen Pelvis Wo Contrast  Result Date: 01/02/2017 CLINICAL DATA:  Newly diagnosed VIPoma. Chronic diarrhea. Recent onset renal failure. EXAM: CT ABDOMEN AND PELVIS WITHOUT CONTRAST TECHNIQUE: Multidetector CT imaging of the abdomen and pelvis was performed following the standard protocol without IV contrast. COMPARISON:  Radiographs from 11/27/2016 FINDINGS: Lower chest: Moderate  cardiomegaly. Dialysis catheter tip: Right atrium. Moderate right pleural effusion with passive atelectasis. Trace pericardial effusion posteriorly. Hepatobiliary: Suspected mild periportal edema. Indistinct gallbladder wall possibly from adjacent fluid. Pancreas: A discrete mass lesion is not appreciable on today's noncontrast CT to correlate with the provided history of VIPoma, although these tumors can be small and negative predictive value of today's noncontrast CT may be below. Spleen: Unremarkable Adrenals/Urinary Tract: Mild diffuse bladder wall thickening slightly out of proportion to the degree of nondistention. Vascular calcifications in both renal hila without appreciable renal calculi. No hydronephrosis. Bilateral perirenal stranding appears symmetric. Currently renal parenchymal thickness is not reduced. Adrenal glands normal. Stomach/Bowel: Mild wall thickening in the jejunum with slight irregularity of the jejunal falls. Orally administered contrast extends through to the colon. Appendix appears mildly thickened at 9 mm diameter on image 66/2. The distal rectal wall appears thickened. No dilated bowel. Vascular/Lymphatic: Considerable atherosclerotic calcification of mesenteric vasculature with only mild atherosclerotic calcification of the aortoiliac tree, pattern favoring diabetes associated atherosclerosis. Scattered primarily small retroperitoneal and pelvic lymph nodes are not pathologically enlarged. Reproductive: Vas deferens calcifications along with vascular calcifications in the penis and scrotum. Other: Mild ascites most notable in the perihepatic region and pelvis. Diffuse mesenteric and omental edema with low-grade edema in the subcutaneous tissues. Musculoskeletal: Healed left seventh rib posterolaterally likely related to prior fracture. Cortical lucency with adjacent sclerosis anteriorly in the left sixth rib on image 8/2, conceivably also from a prior rib fracture but relatively  nonspecific. There is degenerative loss of articular space in both hips. IMPRESSION: 1. No pancreatic mass is identified on today's exam to correlate with the history of the VIPoma, although negative predictive value is reduced by the lack of IV contrast. 2. Suspected third spacing of fluid with diffuse mesenteric and omental edema along with  mild diffuse subcutaneous edema. Small amount of pelvic and perihepatic ascites. 3. Moderate cardiomegaly with moderate right pleural effusion with associated passive atelectasis. 4. Mild periportal edema, nonspecific. 5. Bladder wall thickening slightly out of proportion to the degree of nondistention, cystitis is not excluded. 6. Mild wall thickening in portions of the bowel including the jejunum and rectum, nonspecific. Some of this may be due to the third spacing of fluid. Proximal enteritis not excluded. 7. Extensive atherosclerosis in a pattern favoring diabetic association. 8. Healed left seventh rib fracture ; sclerosis and adjacent cortical lucency in the left sixth rib anterolaterally is less specific but may represent a healing fracture or stress injury. An enchondroma can have a similar appearance although the sclerosis would be unusual. Electronically Signed   By: Van Clines M.D.   On: 01/02/2017 09:11   Ct Head Wo Contrast  Result Date: 01/12/2017 CLINICAL DATA:  Altered level of consciousness. Dizziness leading to fall. EXAM: CT HEAD WITHOUT CONTRAST TECHNIQUE: Contiguous axial images were obtained from the base of the skull through the vertex without intravenous contrast. COMPARISON:  None. FINDINGS: Brain: Mild generalized atrophy for age. No intracranial hemorrhage, mass effect, or midline shift. No hydrocephalus. The basilar cisterns are patent. No evidence of territorial infarct or acute ischemia. No extra-axial or intracranial fluid collection. Vascular: Atherosclerosis of skullbase vasculature without hyperdense vessel or abnormal  calcification. Skull: No fracture or focal lesion. Sinuses/Orbits: Paranasal sinuses and mastoid air cells are clear. The visualized orbits are unremarkable. Probable cataract resection on the left. Other: Subcutaneous calcifications suggest diabetes. IMPRESSION: No acute intracranial abnormality. Electronically Signed   By: Jeb Levering M.D.   On: 01/12/2017 00:34   Dg Chest Port 1 View  Result Date: 01/13/2017 CLINICAL DATA:  46 year old male status post dialysis catheter insertion. EXAM: PORTABLE CHEST 1 VIEW COMPARISON:  01/12/2017 FINDINGS: Right-sided dialysis catheter terminates in the right atrium. There is stable cardiomegaly and pulmonary vascular congestion. No focal parenchymal consolidation, sizable effusion or pneumothorax. No acute osseous abnormalities. IMPRESSION: Right-sided dialysis catheter terminating in the right atrium. Otherwise unchanged radiographic examination of the chest. Electronically Signed   By: Kristopher Oppenheim M.D.   On: 01/13/2017 11:28   Dg Abd Acute W/chest  Result Date: 01/12/2017 CLINICAL DATA:  Vomiting. EXAM: DG ABDOMEN ACUTE W/ 1V CHEST COMPARISON:  CT 01/01/2017, radiograph 11/27/2016 FINDINGS: Right-sided dialysis catheter remains in place. Elevated right hemidiaphragm with right pleural effusion. There is cardiomegaly and vascular congestion. Increased air throughout small and large bowel in a nonobstructive pattern appears similar to prior exam. No free air. Small colonic stool burden. Extensive vascular calcifications. The bones are under mineralized. IMPRESSION: 1. Cardiomegaly with right pleural effusion and vascular congestion. 2. No bowel obstruction or free air. Increased air throughout nondilated bowel is similar to prior exams, enteritis could have this appearance. Electronically Signed   By: Jeb Levering M.D.   On: 01/12/2017 00:48   Dg Fluoro Guide Cv Line-no Report  Result Date: 01/13/2017 Fluoroscopy was utilized by the requesting  physician.  No radiographic interpretation.   Dg Hip Unilat W Or Wo Pelvis 2-3 Views Right  Result Date: 01/08/2017 CLINICAL DATA:  [place] last night with right hip pain EXAM: DG HIP (WITH OR WITHOUT PELVIS) 2-3V RIGHT COMPARISON:  CT abdomen pelvis of 01/01/2017 FINDINGS: No acute fracture is seen. There is mild degenerative joint disease of both hips, left-greater-than-right. The pelvic rami are intact. The SI joints appear corticated. Considerable arterial calcification is present diffusely. IMPRESSION: 1. No  acute fracture. 2. Degenerative change in the hips left-greater-than-right. Diffuse arterial calcifications. Electronically Signed   By: Ivar Drape M.D.   On: 01/08/2017 08:47    Assessment/Plan  1. Refusal of medication - after explaining the importance of the insulins, he has agreed to take medications   2. Diabetes mellitus with ESRD (end-stage renal disease) ([place]) - continue Levemir 100 units/ml 10 units SQ Q AM, Novolog 100 units/ml sliding scale SQ TID Lab Results  Component Value Date   HGBA1C 8.3 (H) 12/11/2016     3. Chronic diarrhea - continue Loperamide 2 mg 1 tab Q 4 hours PRN, Creon24,000 units PO TID and Cholestyramine 4 g 1 packet PO BID, will refer for GI consult     Durenda Age, NP [place] [phone] (Monday-Friday 8:00 a.m. - 5:00 p.m.) [phone] (after hours)

## 2017-01-31 ENCOUNTER — Other Ambulatory Visit (HOSPITAL_COMMUNITY): Payer: BLUE CROSS/BLUE SHIELD

## 2017-01-31 ENCOUNTER — Encounter: Payer: BLUE CROSS/BLUE SHIELD | Admitting: Vascular Surgery

## 2017-01-31 ENCOUNTER — Inpatient Hospital Stay (HOSPITAL_COMMUNITY): Admission: RE | Admit: 2017-01-31 | Payer: BLUE CROSS/BLUE SHIELD | Source: Ambulatory Visit

## 2017-02-04 ENCOUNTER — Non-Acute Institutional Stay (SKILLED_NURSING_FACILITY): Payer: Medicare Other | Admitting: Adult Health

## 2017-02-04 ENCOUNTER — Encounter: Payer: Self-pay | Admitting: Adult Health

## 2017-02-04 DIAGNOSIS — N186 End stage renal disease: Secondary | ICD-10-CM

## 2017-02-04 DIAGNOSIS — Z992 Dependence on renal dialysis: Secondary | ICD-10-CM | POA: Diagnosis not present

## 2017-02-04 DIAGNOSIS — E162 Hypoglycemia, unspecified: Secondary | ICD-10-CM | POA: Diagnosis not present

## 2017-02-04 DIAGNOSIS — R400 Somnolence: Secondary | ICD-10-CM | POA: Diagnosis not present

## 2017-02-04 NOTE — Progress Notes (Signed)
Location:  [place] Room Number: 108-A Place of Service:  SNF (31) Provider:  Durenda Age, NP  Patient Care Team: Vivi Barrack, MD as PCP - General (Family Medicine)  Extended Emergency Contact Information Primary Emergency Contact: Larene Beach States of [place] Phone: [phone] Work Phone: [phone] Relation: Sister Secondary Emergency Contact: [place] Mobile Phone: [phone] Relation: Significant other  Code Status:  Full Code  Goals of care: Advanced Directive information Advanced Directives 01/11/2017  Does Patient Have a Medical Advance Directive? Yes  Type of Paramedic of [place];Living will  Would patient like information on creating a medical advance directive? -     Chief Complaint  Patient presents with  . Acute Visit    Altered mental status, elevated blood pressure    HPI:  Pt is a 46 y.o. male seen today for an acute visit due to altered mental status.  He is a short-term rehabilitation resident of [facility].  He has a PMH of anoxic brain injury, DM without complication, HLD, HTN, kidney failure, Picker's nodule, pancreatic tumor, and vipoma. Charge nurse reported that he is "not responding." Patient was seen lying on his bed with his head turned to left side. His eyes were open but not verbally responding nor moving. Pupils were reacting but sluggish. CBG was checked, 36. Glucagon 1 mg was given IM X 1. Re-check ed CBG= 33. Glucagon 1 mg IM given again. Re-checked CBG = 42.      Past Medical History:  Diagnosis Date  . Allergy   . Anoxic brain injury ([place])   . Diabetes mellitus without complication ([place])   . Hyperlipidemia   . Hypertension   . Kidney failure   . Pancreatic tumor   . Picker's nodule   . Vipoma [facility])    Past Surgical History:  Procedure Laterality Date  . EYE SURGERY Left   . INSERTION OF DIALYSIS CATHETER Right  01/13/2017   Procedure: INSERTION OF DIALYSIS CATHETER - Right Internal Jugular Placement;  Surgeon: Angelia Mould, MD;  Location: [facility] OR;  Service: Vascular;  Laterality: Right;    No Known Allergies  Outpatient Encounter Medications as of 02/04/2017  Medication Sig  . aspirin EC 81 MG EC tablet Take 1 tablet (81 mg total) by mouth daily.  Marland Kitchen b complex-vitamin c-folic acid (NEPHRO-VITE) 0.8 MG TABS tablet Take 1 tablet by mouth daily.  . camphor-menthol (SARNA) lotion Apply topically as needed for itching.  . cetirizine (ZYRTEC) 10 MG tablet Take 10 mg by mouth at bedtime.   . cholestyramine (QUESTRAN) 4 g packet Take 4 g by mouth daily.  . folic acid-pyridoxine-cyancobalamin (FOLTX) 2.5-25-2 MG TABS tablet Take 1 tablet by mouth daily.  . hydrocerin (EUCERIN) CREA Apply 1 application topically 2 (two) times daily.  . insulin aspart (NOVOLOG) 100 UNIT/ML injection Inject 0-9 Units into the skin 3 (three) times daily with meals. CBG 70 - 120: 0 units CBG 121 - 150: 1 unit CBG 151 - 200: 2 units CBG 201 - 250: 3 units CBG 251 - 300: 5 units CBG 301 - 350: 7 units CBG 351 - 400: 9 units  . insulin detemir (LEVEMIR) 100 UNIT/ML injection Inject 0.1-0.15 mLs (10-15 Units total) into the skin 2 (two) times daily. 15 units at 6 am and 10 units at 6 pm  . loperamide (IMODIUM A-D) 2 MG tablet Take 1 tablet (2 mg total) by mouth 4 (four) times daily as needed for diarrhea  or loose stools.  . Nutritional Supplements (FEEDING SUPPLEMENT, NEPRO CARB STEADY,) LIQD Take 237 mLs by mouth 2 (two) times daily between meals.  . Pancrelipase, Lip-Prot-Amyl, (CREON) 24000-76000 units CPEP Take 1 capsule by mouth 3 (three) times daily before meals.  . pantoprazole (PROTONIX) 40 MG tablet Take 1 tablet (40 mg total) by mouth daily.  Marland Kitchen thiamine (VITAMIN B-1) 100 MG tablet Take 1 tablet (100 mg total) by mouth daily.  . traMADol (ULTRAM) 50 MG tablet Take 1 tablet (50 mg total) by mouth every 12 (twelve)  hours as needed for moderate pain.  . vitamin B-12 (CYANOCOBALAMIN) 1000 MCG tablet Take 1,000 mcg by mouth daily.  . [DISCONTINUED] cholestyramine (QUESTRAN) 4 g packet Take 1 packet (4 g total) by mouth 2 (two) times daily.  . [DISCONTINUED] lipase/protease/amylase (CREON) 12000 units CPEP capsule Take 2 capsules (24,000 Units total) by mouth 3 (three) times daily with meals.   No facility-administered encounter medications on file as of 02/04/2017.     Review of Systems  Unable to obtain due to unresponsiveness    Immunization History  Administered Date(s) Administered  . Influenza-Unspecified 11/19/2016  . Pneumococcal-Unspecified 11/19/2016   Pertinent  Health Maintenance Due  Topic Date Due  . OPHTHALMOLOGY EXAM  02/07/1980  . HEMOGLOBIN A1C  06/11/2017  . FOOT EXAM  01/02/2018  . INFLUENZA VACCINE  Completed      Vitals:   02/04/17 1006  BP: (!) 150/101  Pulse: (!) 110  Resp: 20  Temp: 98 F (36.7 C)  TempSrc: Oral  SpO2: 100%  Weight: 136 lb 7.5 oz (61.9 kg)  Height: 6' (1.829 m)   Body mass index is 18.51 kg/m.  Physical Exam  GENERAL APPEARANCE:  Sleeping in bed, unresponsive but opens eyes SKIN:  Multiple scabs all over his body MOUTH and THROAT: Lips are without lesions. Oral mucosa is moist and without lesions.  RESPIRATORY: Breathing is even & unlabored, BS CTAB CARDIAC: tachycardic , no murmur,no extra heart sounds, no edema, right IJ GI: Abdomen soft, normal BS, no masses EXTREMITIES:  No cyanosis, no edema NEUROLOGICAL: unresponsive,    Labs reviewed: Recent Labs    01/13/17 0654  01/14/17 0218 01/15/17 0957 01/16/17 1113 01/18/17 0654 01/29/17  NA 132*  --  133* 131* 133* 131* 139  K 4.8  --  5.8* 5.3* 3.8 3.9 5.8*  CL 98*  --  98* 97* 97* 101  --   CO2 21*  --  19* 18* 24 18*  --   GLUCOSE 99  --  83 77 184* 119*  --   BUN 40*  --  50* 74* 41* 37* 66*  CREATININE 6.47*   < > 7.94* 10.04* 6.98* 7.35* 10.8*  CALCIUM 8.1*  --  7.9*  7.6* 7.5* 7.6*  --   MG  --   --  1.9  --   --   --   --   PHOS 6.1*  --   --  8.7* 5.8*  --   --    < > = values in this interval not displayed.   Recent Labs    12/11/16 1208 01/11/17 1830  01/14/17 0218 01/15/17 0957 01/16/17 1113  AST 17 41  --  20  --   --   ALT 19 44  --  21  --   --   ALKPHOS 309* 568*  --  337*  --   --   BILITOT 0.7 1.2  --  1.7*  --   --  PROT 7.0 6.9  --  6.1*  --   --   ALBUMIN 3.5 2.9*   < > 2.6* 2.4* 2.3*   < > = values in this interval not displayed.   Recent Labs    11/27/16 2141  01/15/17 0955 01/16/17 1113 01/18/17 0654  WBC 7.5   < > 8.8 8.2 8.1  NEUTROABS 4.8  --   --   --   --   HGB 10.6*   < > 10.5* 10.5* 10.3*  HCT 32.5*   < > 33.4* 33.4* 32.6*  MCV 81.9   < > 82.9 83.7 84.9  PLT 207   < > 264 251 233   < > = values in this interval not displayed.    Lab Results  Component Value Date   HGBA1C 8.3 (H) 12/11/2016   Lab Results  Component Value Date   CHOL 106 12/11/2016   HDL 47.80 12/11/2016   LDLCALC 45 12/11/2016   TRIG 63.0 12/11/2016   CHOLHDL 2 12/11/2016    Significant Diagnostic Results in last 30 days:  Ct Head Wo Contrast  Result Date: 01/12/2017 CLINICAL DATA:  Altered level of consciousness. Dizziness leading to fall. EXAM: CT HEAD WITHOUT CONTRAST TECHNIQUE: Contiguous axial images were obtained from the base of the skull through the vertex without intravenous contrast. COMPARISON:  None. FINDINGS: Brain: Mild generalized atrophy for age. No intracranial hemorrhage, mass effect, or midline shift. No hydrocephalus. The basilar cisterns are patent. No evidence of territorial infarct or acute ischemia. No extra-axial or intracranial fluid collection. Vascular: Atherosclerosis of skullbase vasculature without hyperdense vessel or abnormal calcification. Skull: No fracture or focal lesion. Sinuses/Orbits: Paranasal sinuses and mastoid air cells are clear. The visualized orbits are unremarkable. Probable cataract  resection on the left. Other: Subcutaneous calcifications suggest diabetes. IMPRESSION: No acute intracranial abnormality. Electronically Signed   By: Jeb Levering M.D.   On: 01/12/2017 00:34   Dg Chest Port 1 View  Result Date: 01/13/2017 CLINICAL DATA:  46 year old male status post dialysis catheter insertion. EXAM: PORTABLE CHEST 1 VIEW COMPARISON:  01/12/2017 FINDINGS: Right-sided dialysis catheter terminates in the right atrium. There is stable cardiomegaly and pulmonary vascular congestion. No focal parenchymal consolidation, sizable effusion or pneumothorax. No acute osseous abnormalities. IMPRESSION: Right-sided dialysis catheter terminating in the right atrium. Otherwise unchanged radiographic examination of the chest. Electronically Signed   By: Kristopher Oppenheim M.D.   On: 01/13/2017 11:28   Dg Abd Acute W/chest  Result Date: 01/12/2017 CLINICAL DATA:  Vomiting. EXAM: DG ABDOMEN ACUTE W/ 1V CHEST COMPARISON:  CT 01/01/2017, radiograph 11/27/2016 FINDINGS: Right-sided dialysis catheter remains in place. Elevated right hemidiaphragm with right pleural effusion. There is cardiomegaly and vascular congestion. Increased air throughout small and large bowel in a nonobstructive pattern appears similar to prior exam. No free air. Small colonic stool burden. Extensive vascular calcifications. The bones are under mineralized. IMPRESSION: 1. Cardiomegaly with right pleural effusion and vascular congestion. 2. No bowel obstruction or free air. Increased air throughout nondilated bowel is similar to prior exams, enteritis could have this appearance. Electronically Signed   By: Jeb Levering M.D.   On: 01/12/2017 00:48   Dg Fluoro Guide Cv Line-no Report  Result Date: 01/13/2017 Fluoroscopy was utilized by the requesting physician.  No radiographic interpretation.   Dg Hip Unilat W Or Wo Pelvis 2-3 Views Right  Result Date: 01/08/2017 CLINICAL DATA:  [place] last night with right hip pain EXAM: DG  HIP (WITH OR WITHOUT PELVIS) 2-3V RIGHT  COMPARISON:  CT abdomen pelvis of 01/01/2017 FINDINGS: No acute fracture is seen. There is mild degenerative joint disease of both hips, left-greater-than-right. The pelvic rami are intact. The SI joints appear corticated. Considerable arterial calcification is present diffusely. IMPRESSION: 1. No acute fracture. 2. Degenerative change in the hips left-greater-than-right. Diffuse arterial calcifications. Electronically Signed   By: Ivar Drape M.D.   On: 01/08/2017 08:47    Assessment/Plan  1. Hypoglycemia -  CBG was was 36 and was given Glucagon 1 mg IM, repeat CBG was 33 so another Glucagon 1 mg IM was given, repeat CBG was 42 then after 15 minutes became 56 and at this time patient is verbally responsive asking for orange juice and about his blood sugar reading. Discontinue current Levemir orders, start Levemir 5 units SQ Q AM and continue Novolog Sliding scale TID  with meals    2. Somnolence - thought to be from hypoglycemia, Glucagon 1 mg IM X 2 were given and patient became verbally responsive and alert.  3. ESRD - will continue hemodialysis today and re-check before leaving for hemodialysis.      Durenda Age, NP [facility] [phone] (Monday-Friday 8:00 a.m. - 5:00 p.m.) [phone] (after hours)

## 2017-02-05 ENCOUNTER — Non-Acute Institutional Stay (SKILLED_NURSING_FACILITY): Payer: Medicare Other | Admitting: Adult Health

## 2017-02-05 ENCOUNTER — Encounter: Payer: Self-pay | Admitting: Adult Health

## 2017-02-05 DIAGNOSIS — N186 End stage renal disease: Secondary | ICD-10-CM

## 2017-02-05 DIAGNOSIS — Z7189 Other specified counseling: Secondary | ICD-10-CM

## 2017-02-05 DIAGNOSIS — E1122 Type 2 diabetes mellitus with diabetic chronic kidney disease: Secondary | ICD-10-CM | POA: Diagnosis not present

## 2017-02-05 DIAGNOSIS — F29 Unspecified psychosis not due to a substance or known physiological condition: Secondary | ICD-10-CM

## 2017-02-05 NOTE — Progress Notes (Signed)
Location:  [place] Room Number: 108-A Place of Service:  SNF (31) Provider:  Durenda Age, NP  Patient Care Team: Vivi Barrack, MD as PCP - General (Family Medicine)  Extended Emergency Contact Information Primary Emergency Contact: Larene Beach States of [place] Phone: [phone] Work Phone: [phone] Relation: Sister Secondary Emergency Contact: [place] Mobile Phone: [phone] Relation: Significant other  Code Status:  Full Code  Goals of care: Advanced Directive information Advanced Directives 01/11/2017  Does Patient Have a Medical Advance Directive? Yes  Type of Paramedic of [place];Living will  Would patient like information on creating a medical advance directive? -     Chief Complaint  Patient presents with  . Acute Visit    Agitation, refusing medications    HPI:  Pt is a 46 y.o. male seen today for an acute visit secondary to agitation and is refusing medications.  He is a short-term rehabilitation resident at [facility]. He has a PMH of anoxic brain injury, DM without complication, HLD, HTN, kidney failure, Picker's nodule, pancreatic tumor, and vipoma. He has refused his Levemir 5 units injection today. CBG before breakfast was 96 and before lunch was 185. DON reported that patient played with his stool. Sister was visiting and informed her that Patient will be started on Risperdal 0.5 mg BID. She agreed with the plan. She said that Song plays with his stool even at home. Dialysis social worker and charge nurse called and said that whenever he starts playing with his stool while having dialysis, they will stop dialysis and he will need to be picked up. Dialysis center staff talked to Carlyon Shadow (significant other in [place]) and that she is working on getting a Actuary.    Past Medical History:  Diagnosis Date  . Allergy   . Anoxic brain injury  ([place])   . Diabetes mellitus without complication ([place])   . Hyperlipidemia   . Hypertension   . Kidney failure   . Pancreatic tumor   . Picker's nodule   . Vipoma [facility])    Past Surgical History:  Procedure Laterality Date  . EYE SURGERY Left   . INSERTION OF DIALYSIS CATHETER Right 01/13/2017   Procedure: INSERTION OF DIALYSIS CATHETER - Right Internal Jugular Placement;  Surgeon: Angelia Mould, MD;  Location: [facility] OR;  Service: Vascular;  Laterality: Right;    No Known Allergies  Outpatient Encounter Medications as of 02/05/2017  Medication Sig  . aspirin EC 81 MG EC tablet Take 1 tablet (81 mg total) by mouth daily.  Marland Kitchen b complex-vitamin c-folic acid (NEPHRO-VITE) 0.8 MG TABS tablet Take 1 tablet by mouth daily.  . camphor-menthol (SARNA) lotion Apply topically as needed for itching.  . cetirizine (ZYRTEC) 10 MG tablet Take 10 mg by mouth at bedtime.   . cholestyramine (QUESTRAN) 4 g packet Take 4 g by mouth daily.  . folic acid-pyridoxine-cyancobalamin (FOLTX) 2.5-25-2 MG TABS tablet Take 1 tablet by mouth daily.  . hydrocerin (EUCERIN) CREA Apply 1 application topically 2 (two) times daily.  . insulin aspart (NOVOLOG) 100 UNIT/ML injection Inject 0-9 Units into the skin 3 (three) times daily with meals. CBG 70 - 120: 0 units CBG 121 - 150: 1 unit CBG 151 - 200: 2 units CBG 201 - 250: 3 units CBG 251 - 300: 5 units CBG 301 - 350: 7 units CBG 351 - 400: 9 units  . insulin detemir (LEVEMIR) 100 UNIT/ML injection Inject 5 Units into the  skin every morning.  . loperamide (IMODIUM A-D) 2 MG tablet Take 1 tablet (2 mg total) by mouth 4 (four) times daily as needed for diarrhea or loose stools.  . Nutritional Supplements (FEEDING SUPPLEMENT, NEPRO CARB STEADY,) LIQD Take 237 mLs by mouth 2 (two) times daily between meals.  . Pancrelipase, Lip-Prot-Amyl, (CREON) 24000-76000 units CPEP Take 1 capsule by mouth 3 (three) times daily before meals.  . pantoprazole (PROTONIX) 40 MG  tablet Take 1 tablet (40 mg total) by mouth daily.  Marland Kitchen thiamine (VITAMIN B-1) 100 MG tablet Take 1 tablet (100 mg total) by mouth daily.  . traMADol (ULTRAM) 50 MG tablet Take 1 tablet (50 mg total) by mouth every 12 (twelve) hours as needed for moderate pain.  . vitamin B-12 (CYANOCOBALAMIN) 1000 MCG tablet Take 1,000 mcg by mouth daily.  . [DISCONTINUED] insulin detemir (LEVEMIR) 100 UNIT/ML injection Inject 0.1-0.15 mLs (10-15 Units total) into the skin 2 (two) times daily. 15 units at 6 am and 10 units at 6 pm   No facility-administered encounter medications on file as of 02/05/2017.     Review of Systems   GENERAL: No fever, chills  MOUTH and THROAT: Denies oral discomfort, gingival pain or bleeding RESPIRATORY: no cough, SOB, DOE, wheezing, hemoptysis CARDIAC: No chest pain, edema or palpitations GI: No abdominal pain, diarrhea, constipation, heart burn, nausea or vomiting PSYCHIATRIC: Denies feelings of depression or anxiety. No report of hallucinations, insomnia, paranoia, or agitation   Immunization History  Administered Date(s) Administered  . Influenza-Unspecified 11/19/2016  . Pneumococcal-Unspecified 11/19/2016   Pertinent  Health Maintenance Due  Topic Date Due  . OPHTHALMOLOGY EXAM  02/07/1980  . HEMOGLOBIN A1C  06/11/2017  . FOOT EXAM  01/02/2018  . INFLUENZA VACCINE  Completed      Vitals:   02/05/17 1535  BP: 120/86  Pulse: 88  Resp: 20  Temp: 98.8 F (37.1 C)  TempSrc: Oral  SpO2: 100%  Weight: 145 lb 3.2 oz (65.9 kg)  Height: 6' (1.829 m)   Body mass index is 19.69 kg/m.  Physical Exam  GENERAL APPEARANCE: In no acute distress.  SKIN:  Multiple scabs all over his body MOUTH and THROAT: Lips are without lesions. Oral mucosa is moist and without lesions. RESPIRATORY: Breathing is even & unlabored, BS CTAB CARDIAC: RRR, no murmur,no extra heart sounds, no edema GI: Abdomen soft, normal BS, no masses, no tenderness EXTREMITIES:  Able to move X 4  extremities PSYCHIATRIC: Alert to self and date, disoriented to place. Affect and behavior are appropriate    Labs reviewed: Recent Labs    01/13/17 0654  01/14/17 0218 01/15/17 0957 01/16/17 1113 01/18/17 0654 01/29/17  NA 132*  --  133* 131* 133* 131* 139  K 4.8  --  5.8* 5.3* 3.8 3.9 5.8*  CL 98*  --  98* 97* 97* 101  --   CO2 21*  --  19* 18* 24 18*  --   GLUCOSE 99  --  83 77 184* 119*  --   BUN 40*  --  50* 74* 41* 37* 66*  CREATININE 6.47*   < > 7.94* 10.04* 6.98* 7.35* 10.8*  CALCIUM 8.1*  --  7.9* 7.6* 7.5* 7.6*  --   MG  --   --  1.9  --   --   --   --   PHOS 6.1*  --   --  8.7* 5.8*  --   --    < > = values in this  interval not displayed.   Recent Labs    12/11/16 1208 01/11/17 1830  01/14/17 0218 01/15/17 0957 01/16/17 1113  AST 17 41  --  20  --   --   ALT 19 44  --  21  --   --   ALKPHOS 309* 568*  --  337*  --   --   BILITOT 0.7 1.2  --  1.7*  --   --   PROT 7.0 6.9  --  6.1*  --   --   ALBUMIN 3.5 2.9*   < > 2.6* 2.4* 2.3*   < > = values in this interval not displayed.   Recent Labs    11/27/16 2141  01/15/17 0955 01/16/17 1113 01/18/17 0654  WBC 7.5   < > 8.8 8.2 8.1  NEUTROABS 4.8  --   --   --   --   HGB 10.6*   < > 10.5* 10.5* 10.3*  HCT 32.5*   < > 33.4* 33.4* 32.6*  MCV 81.9   < > 82.9 83.7 84.9  PLT 207   < > 264 251 233   < > = values in this interval not displayed.    Lab Results  Component Value Date   HGBA1C 8.3 (H) 12/11/2016   Lab Results  Component Value Date   CHOL 106 12/11/2016   HDL 47.80 12/11/2016   LDLCALC 45 12/11/2016   TRIG 63.0 12/11/2016   CHOLHDL 2 12/11/2016    Significant Diagnostic Results in last 30 days:  Ct Head Wo Contrast  Result Date: 01/12/2017 CLINICAL DATA:  Altered level of consciousness. Dizziness leading to fall. EXAM: CT HEAD WITHOUT CONTRAST TECHNIQUE: Contiguous axial images were obtained from the base of the skull through the vertex without intravenous contrast. COMPARISON:  None.  FINDINGS: Brain: Mild generalized atrophy for age. No intracranial hemorrhage, mass effect, or midline shift. No hydrocephalus. The basilar cisterns are patent. No evidence of territorial infarct or acute ischemia. No extra-axial or intracranial fluid collection. Vascular: Atherosclerosis of skullbase vasculature without hyperdense vessel or abnormal calcification. Skull: No fracture or focal lesion. Sinuses/Orbits: Paranasal sinuses and mastoid air cells are clear. The visualized orbits are unremarkable. Probable cataract resection on the left. Other: Subcutaneous calcifications suggest diabetes. IMPRESSION: No acute intracranial abnormality. Electronically Signed   By: Jeb Levering M.D.   On: 01/12/2017 00:34   Dg Chest Port 1 View  Result Date: 01/13/2017 CLINICAL DATA:  46 year old male status post dialysis catheter insertion. EXAM: PORTABLE CHEST 1 VIEW COMPARISON:  01/12/2017 FINDINGS: Right-sided dialysis catheter terminates in the right atrium. There is stable cardiomegaly and pulmonary vascular congestion. No focal parenchymal consolidation, sizable effusion or pneumothorax. No acute osseous abnormalities. IMPRESSION: Right-sided dialysis catheter terminating in the right atrium. Otherwise unchanged radiographic examination of the chest. Electronically Signed   By: Kristopher Oppenheim M.D.   On: 01/13/2017 11:28   Dg Abd Acute W/chest  Result Date: 01/12/2017 CLINICAL DATA:  Vomiting. EXAM: DG ABDOMEN ACUTE W/ 1V CHEST COMPARISON:  CT 01/01/2017, radiograph 11/27/2016 FINDINGS: Right-sided dialysis catheter remains in place. Elevated right hemidiaphragm with right pleural effusion. There is cardiomegaly and vascular congestion. Increased air throughout small and large bowel in a nonobstructive pattern appears similar to prior exam. No free air. Small colonic stool burden. Extensive vascular calcifications. The bones are under mineralized. IMPRESSION: 1. Cardiomegaly with right pleural effusion and  vascular congestion. 2. No bowel obstruction or free air. Increased air throughout nondilated bowel is similar to prior  exams, enteritis could have this appearance. Electronically Signed   By: Jeb Levering M.D.   On: 01/12/2017 00:48   Dg Fluoro Guide Cv Line-no Report  Result Date: 01/13/2017 Fluoroscopy was utilized by the requesting physician.  No radiographic interpretation.   Dg Hip Unilat W Or Wo Pelvis 2-3 Views Right  Result Date: 01/08/2017 CLINICAL DATA:  [place] last night with right hip pain EXAM: DG HIP (WITH OR WITHOUT PELVIS) 2-3V RIGHT COMPARISON:  CT abdomen pelvis of 01/01/2017 FINDINGS: No acute fracture is seen. There is mild degenerative joint disease of both hips, left-greater-than-right. The pelvic rami are intact. The SI joints appear corticated. Considerable arterial calcification is present diffusely. IMPRESSION: 1. No acute fracture. 2. Degenerative change in the hips left-greater-than-right. Diffuse arterial calcifications. Electronically Signed   By: Ivar Drape M.D.   On: 01/08/2017 08:47    Assessment/Plan  1. Psychosis, unspecified psychosis type ([place]) - will start Risperdal 0.5 mg BID, Psych consult, check BMP, CBC and urinalysis with culture and sensitivity to rule out and infection   2. Diabetes mellitus with ESRD (end-stage renal disease) ([place]) - CBG this morning 96 and at lunch 185, refused Levemir this morning, will disontinue Levemir and continue Novolog sliding scale TID Lab Results  Component Value Date   HGBA1C 8.3 (H) 12/11/2016    3. Counseling regarding advanced care planning and goals of care - talked to dialysis staff and sister regarding plan of care when patient plays with his stool during dialysis    Durenda Age, NP [facility] [phone] (Monday-Friday 8:00 a.m. - 5:00 p.m.) [phone] (after hours)

## 2017-02-08 ENCOUNTER — Encounter: Payer: Self-pay | Admitting: Adult Health

## 2017-02-08 ENCOUNTER — Non-Acute Institutional Stay (SKILLED_NURSING_FACILITY): Payer: Medicare Other | Admitting: Adult Health

## 2017-02-08 DIAGNOSIS — N186 End stage renal disease: Secondary | ICD-10-CM | POA: Diagnosis not present

## 2017-02-08 DIAGNOSIS — C254 Malignant neoplasm of endocrine pancreas: Secondary | ICD-10-CM | POA: Diagnosis not present

## 2017-02-08 DIAGNOSIS — J309 Allergic rhinitis, unspecified: Secondary | ICD-10-CM

## 2017-02-08 DIAGNOSIS — F29 Unspecified psychosis not due to a substance or known physiological condition: Secondary | ICD-10-CM

## 2017-02-08 DIAGNOSIS — G931 Anoxic brain damage, not elsewhere classified: Secondary | ICD-10-CM

## 2017-02-08 DIAGNOSIS — E1122 Type 2 diabetes mellitus with diabetic chronic kidney disease: Secondary | ICD-10-CM

## 2017-02-08 DIAGNOSIS — Z992 Dependence on renal dialysis: Secondary | ICD-10-CM

## 2017-02-08 LAB — BASIC METABOLIC PANEL
BUN: 65 — AB (ref 4–21)
Creatinine: 10.1 — AB (ref 0.6–1.3)
GLUCOSE: 152
Potassium: 5 (ref 3.4–5.3)
Sodium: 139 (ref 137–147)

## 2017-02-08 LAB — CBC AND DIFFERENTIAL
HCT: 34 — AB (ref 41–53)
Hemoglobin: 10.8 — AB (ref 13.5–17.5)
NEUTROS ABS: 9
PLATELETS: 296 (ref 150–399)
WBC: 12.2

## 2017-02-08 NOTE — Progress Notes (Signed)
Location:  [facility] Room Number: 108-A Place of Service:  SNF (31) Provider:  Durenda Age, NP  Patient Care Team: Vivi Barrack, MD as PCP - General (Family Medicine)  Extended Emergency Contact Information Primary Emergency Contact: [place]rene [place] Phone: [phone] Work Phone: [phone] Relation: Sister Secondary Emergency Contact: [place] Phone: [phone] Relation: Significant other  Code Status:  Full  Goals of care: Advanced Directive information Advanced Directives 01/11/2017  Does Patient Have a Medical Advance Directive? Yes  Type of Paramedic of [place];Living will  Would patient like information on creating a medical advance directive? -    Chief Complaint  Patient presents with  . Discharge Note    Patient is discharging back to [place] as soon as a flight can be arranged    HPI:  Philip West is a 46 y.o. male seen today for a discharge visit.  He is going to transfer to a facility in [place] and will leave as soon as a flight can be arranged.   He has been admitted to [facility] on 01/18/17 from [facility] hospitalization dates 01/11/17 to 01/18/17 for anoxic brain injury. He was brought to the hospital due to declining status. He has been living with his sister.He used to living in [place] but was found down by [facility]   In February. Sister rhim      eported that he has progressively declined and she can no longer take care of him. He had multiple falls at home. He is a dialysis patient and while in the ED he pulled out his tunneled dialysis catheter. Vascular was consulted and replaced catheter, right IJ on 01/13/17. He has chronic diarrhea for 10 years. He has a PMH of anoxic brain injury, DM without complication, HLD, HTN, kidney failure, Picker's nodule, pancreatic tumor, and vipoma. He was seen in the room and was reported that he  refused dialysis for the 2nd time. He said that he doesn't like to go to dialysis anymore. Explained the importance and the consequences of not having dialysis. He said  That he still don't like to go back to dialysis. He was reported to have played with his stool while in the dialysis center and was talked to while there. He was recently started on Risperdal for psychosis.   Patient was admitted to this facility for short-term rehabilitation after the patient's recent hospitalization.  Patient has completed SNF rehabilitation and therapy has cleared the patient for discharge.   Past Medical History:  Diagnosis Date  . Allergy   . Anoxic brain injury ([place])   . Diabetes mellitus without complication ([place])   . Hyperlipidemia   . Hypertension   . Kidney failure   . Pancreatic tumor   . Picker's nodule   . Vipoma [facility])    Past Surgical History:  Procedure Laterality Date  . EYE SURGERY Left   . INSERTION OF DIALYSIS CATHETER Right 01/13/2017   Procedure: INSERTION OF DIALYSIS CATHETER - Right Internal Jugular Placement;  Surgeon: Angelia Mould, MD;  Location: [facility];  Service: Vascular;  Laterality: Right;    No Known Allergies  Outpatient Encounter Medications as of 02/08/2017  Medication Sig  . aspirin EC 81 MG EC tablet Take 1 tablet (81 mg total) by mouth daily.  Marland Kitchen b complex-vitamin c-folic acid (NEPHRO-VITE) 0.8 MG TABS tablet Take 1 tablet by mouth daily.  . camphor-menthol (SARNA) lotion Apply topically as needed for itching.  . cetirizine (  ZYRTEC) 10 MG tablet Take 10 mg by mouth at bedtime.   . cholestyramine (QUESTRAN) 4 g packet Take 4 g by mouth daily.  . folic acid-pyridoxine-cyancobalamin (FOLTX) 2.5-25-2 MG TABS tablet Take 1 tablet by mouth daily.  . hydrocerin (EUCERIN) CREA Apply 1 application topically 2 (two) times daily.  . insulin aspart (NOVOLOG) 100 UNIT/ML injection Inject 0-9 Units into the skin 3 (three) times daily with meals. CBG 70 - 120: 0  units CBG 121 - 150: 1 unit CBG 151 - 200: 2 units CBG 201 - 250: 3 units CBG 251 - 300: 5 units CBG 301 - 350: 7 units CBG 351 - 400: 9 units  . loperamide (IMODIUM A-D) 2 MG tablet Take 1 tablet (2 mg total) by mouth 4 (four) times daily as needed for diarrhea or loose stools.  . Nutritional Supplements (FEEDING SUPPLEMENT, NEPRO CARB STEADY,) LIQD Take 237 mLs by mouth 2 (two) times daily between meals.  . Pancrelipase, Lip-Prot-Amyl, (CREON) 24000-76000 units CPEP Take 1 capsule by mouth 3 (three) times daily before meals.  . pantoprazole (PROTONIX) 40 MG tablet Take 1 tablet (40 mg total) by mouth daily.  . risperiDONE (RISPERDAL) 0.5 MG tablet Take 0.5 mg by mouth 2 (two) times daily.  Marland Kitchen thiamine (VITAMIN B-1) 100 MG tablet Take 1 tablet (100 mg total) by mouth daily.  . traMADol (ULTRAM) 50 MG tablet Take 1 tablet (50 mg total) by mouth every 12 (twelve) hours as needed for moderate pain.  . vitamin B-12 (CYANOCOBALAMIN) 1000 MCG tablet Take 1,000 mcg by mouth daily.  . [DISCONTINUED] insulin detemir (LEVEMIR) 100 UNIT/ML injection Inject 5 Units into the skin every morning.   No facility-administered encounter medications on file as of 02/08/2017.     Review of Systems  GENERAL: No change in appetite, no fatigue, no weight changes, no fever, chills or weakness MOUTH and THROAT: Denies oral discomfort, gingival pain or bleeding, pain from teeth or hoarseness   RESPIRATORY: no cough, SOB, DOE, wheezing, hemoptysis CARDIAC: No chest pain, edema or palpitations GI: No abdominal pain, diarrhea, constipation, heart burn, nausea or vomiting GU: Denies dysuria, frequency, hematuria PSYCHIATRIC: Denies feelings of depression or anxiety. No report of hallucinations, insomnia, paranoia, or agitation   Immunization History  Administered Date(s) Administered  . Influenza-Unspecified 11/19/2016  . Pneumococcal-Unspecified 11/19/2016   Pertinent  Health Maintenance Due  Topic Date Due   . OPHTHALMOLOGY EXAM  02/07/1980  . HEMOGLOBIN A1C  06/11/2017  . FOOT EXAM  01/02/2018  . INFLUENZA VACCINE  Completed      Vitals:   02/08/17 1156  BP: (!) 132/58  Pulse: 74  Resp: 20  Temp: (!) 97.5 F (36.4 C)  TempSrc: Oral  SpO2: 100%  Weight: 129 lb 12.8 oz (58.9 kg)  Height: 6' (1.829 m)   Body mass index is 17.6 kg/m.  Physical Exam  GENERAL APPEARANCE:  In no acute distress.  SKIN:  Dry scabs all over his body MOUTH and THROAT: Lips are without lesions. Oral mucosa is moist and without lesions. RESPIRATORY: Breathing is even & unlabored, BS CTAB CARDIAC: RRR, no murmur,no extra heart sounds, no edema, Right IJ dialysis cath GI: Abdomen soft, normal BS, no masses, no tenderness, no hepatomegaly, no splenomegaly EXTREMITIES:  Able to move X 4 extremities PSYCHIATRIC: Alert to self, disoriented to time and place. Affect and behavior are appropriate   Labs reviewed: Recent Labs    01/13/17 0654  01/14/17 0218 01/15/17 0957 01/16/17 1113 01/18/17 0654 01/29/17  NA 132*  --  133* 131* 133* 131* 139  K 4.8  --  5.8* 5.3* 3.8 3.9 5.8*  CL 98*  --  98* 97* 97* 101  --   CO2 21*  --  19* 18* 24 18*  --   GLUCOSE 99  --  83 77 184* 119*  --   BUN 40*  --  50* 74* 41* 37* 66*  CREATININE 6.47*   < > 7.94* 10.04* 6.98* 7.35* 10.8*  CALCIUM 8.1*  --  7.9* 7.6* 7.5* 7.6*  --   MG  --   --  1.9  --   --   --   --   PHOS 6.1*  --   --  8.7* 5.8*  --   --    < > = values in this interval not displayed.   Recent Labs    12/11/16 1208 01/11/17 1830  01/14/17 0218 01/15/17 0957 01/16/17 1113  AST 17 41  --  20  --   --   ALT 19 44  --  21  --   --   ALKPHOS 309* 568*  --  337*  --   --   BILITOT 0.7 1.2  --  1.7*  --   --   PROT 7.0 6.9  --  6.1*  --   --   ALBUMIN 3.5 2.9*   < > 2.6* 2.4* 2.3*   < > = values in this interval not displayed.   Recent Labs    11/27/16 2141  01/15/17 0955 01/16/17 1113 01/18/17 0654  WBC 7.5   < > 8.8 8.2 8.1  NEUTROABS  4.8  --   --   --   --   HGB 10.6*   < > 10.5* 10.5* 10.3*  HCT 32.5*   < > 33.4* 33.4* 32.6*  MCV 81.9   < > 82.9 83.7 84.9  PLT 207   < > 264 251 233   < > = values in this interval not displayed.    Lab Results  Component Value Date   HGBA1C 8.3 (H) 12/11/2016   Lab Results  Component Value Date   CHOL 106 12/11/2016   HDL 47.80 12/11/2016   LDLCALC 45 12/11/2016   TRIG 63.0 12/11/2016   CHOLHDL 2 12/11/2016    Significant Diagnostic Results in last 30 days:  Ct Head Wo Contrast  Result Date: 01/12/2017 CLINICAL DATA:  Altered level of consciousness. Dizziness leading to fall. EXAM: CT HEAD WITHOUT CONTRAST TECHNIQUE: Contiguous axial images were obtained from the base of the skull through the vertex without intravenous contrast. COMPARISON:  None. FINDINGS: Brain: Mild generalized atrophy for age. No intracranial hemorrhage, mass effect, or midline shift. No hydrocephalus. The basilar cisterns are patent. No evidence of territorial infarct or acute ischemia. No extra-axial or intracranial fluid collection. Vascular: Atherosclerosis of skullbase vasculature without hyperdense vessel or abnormal calcification. Skull: No fracture or focal lesion. Sinuses/Orbits: Paranasal sinuses and mastoid air cells are clear. The visualized orbits are unremarkable. Probable cataract resection on the left. Other: Subcutaneous calcifications suggest diabetes. IMPRESSION: No acute intracranial abnormality. Electronically Signed   By: Jeb Levering M.D.   On: 01/12/2017 00:34   Dg Chest Port 1 View  Result Date: 01/13/2017 CLINICAL DATA:  46 year old male status post dialysis catheter insertion. EXAM: PORTABLE CHEST 1 VIEW COMPARISON:  01/12/2017 FINDINGS: Right-sided dialysis catheter terminates in the right atrium. There is stable cardiomegaly and pulmonary vascular congestion. No focal parenchymal consolidation, sizable  effusion or pneumothorax. No acute osseous abnormalities. IMPRESSION:  Right-sided dialysis catheter terminating in the right atrium. Otherwise unchanged radiographic examination of the chest. Electronically Signed   By: Kristopher Oppenheim M.D.   On: 01/13/2017 11:28   Dg Abd Acute W/chest  Result Date: 01/12/2017 CLINICAL DATA:  Vomiting. EXAM: DG ABDOMEN ACUTE W/ 1V CHEST COMPARISON:  CT 01/01/2017, radiograph 11/27/2016 FINDINGS: Right-sided dialysis catheter remains in place. Elevated right hemidiaphragm with right pleural effusion. There is cardiomegaly and vascular congestion. Increased air throughout small and large bowel in a nonobstructive pattern appears similar to prior exam. No free air. Small colonic stool burden. Extensive vascular calcifications. The bones are under mineralized. IMPRESSION: 1. Cardiomegaly with right pleural effusion and vascular congestion. 2. No bowel obstruction or free air. Increased air throughout nondilated bowel is similar to prior exams, enteritis could have this appearance. Electronically Signed   By: Jeb Levering M.D.   On: 01/12/2017 00:48   Dg Fluoro Guide Cv Line-no Report  Result Date: 01/13/2017 Fluoroscopy was utilized by the requesting physician.  No radiographic interpretation.    Assessment/Plan  1. Anoxic brain injury ([place]) - continue vitamin B1 100 mg 1 tab daily vitamin B12 1000 g 1 tab daily and folic acid 1 mg 1 tablet daily   2. 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 Diabetes mellitus with ESRD (end-stage renal disease) ([place]) - continue NovoLog 100 units/mL injected sliding scale subcutaneous 3 times a day Lab Results  Component Value Date   HGBA1C 8.3 (H) 12/11/2016     4. Psychosis, unspecified psychosis type ([place]) - recently started on Risperdal 0.5 mg 1 tab twice a day, for psych consult   5.  End-stage renal disease on hemodialysis ([place]) - continue hemodialysis 3 times a week   6. Allergic rhinitis. -  Stable, Continue cetirizine 10 mg 1 tab daily at bedtime     I have filled out patient's discharge paperwork and written prescriptions.    DME provided: None  Total discharge time: Less than 30 minutes  Discharge time involved coordination of the discharge process with social worker, nursing staff and therapy department.    Durenda Age, NP [facility] [phone] (Monday-Friday 8:00 a.m. - 5:00 p.m.) [phone] (after hours)

## 2017-02-28 ENCOUNTER — Other Ambulatory Visit: Payer: Self-pay

## 2017-02-28 MED ORDER — PANTOPRAZOLE SODIUM 40 MG PO TBEC: 40.0000 mg | DELAYED_RELEASE_TABLET | Freq: Every day | ORAL | 3 refills | Status: AC

## 2018-12-07 IMAGING — CR DG ABDOMEN ACUTE W/ 1V CHEST
3 series · 3 of 3 positions shown · non-contrast
Comparison: CT 01/01/2017, radiograph 11/27/2016

CLINICAL DATA: Vomiting.

EXAM:
DG ABDOMEN ACUTE W/ 1V CHEST

[chest pa]
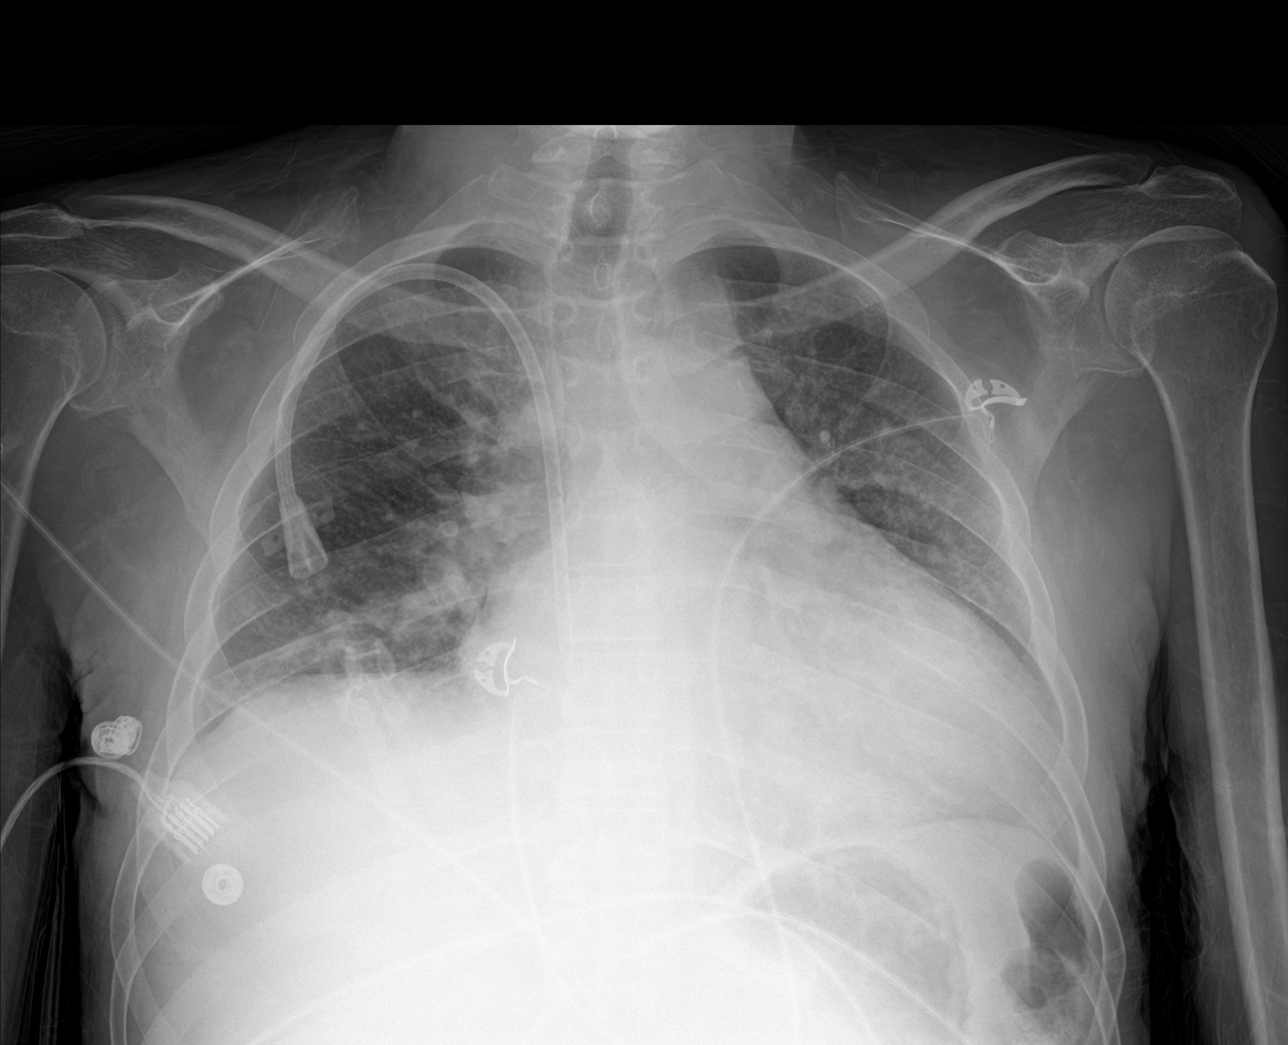

[abdomen erect]
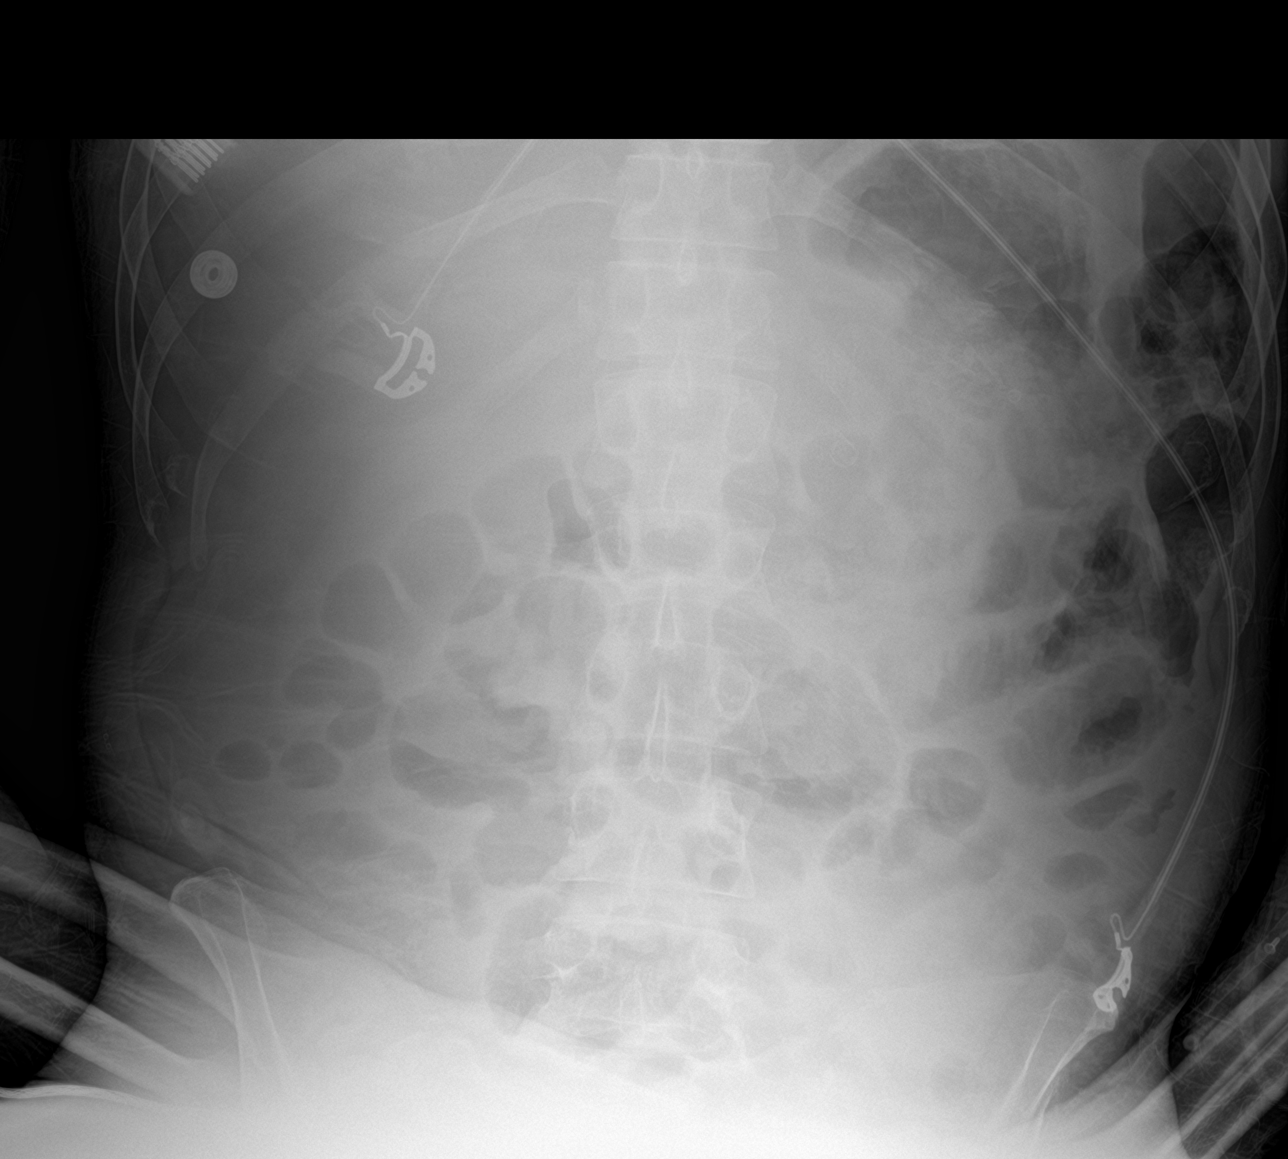

[abdomen supine]
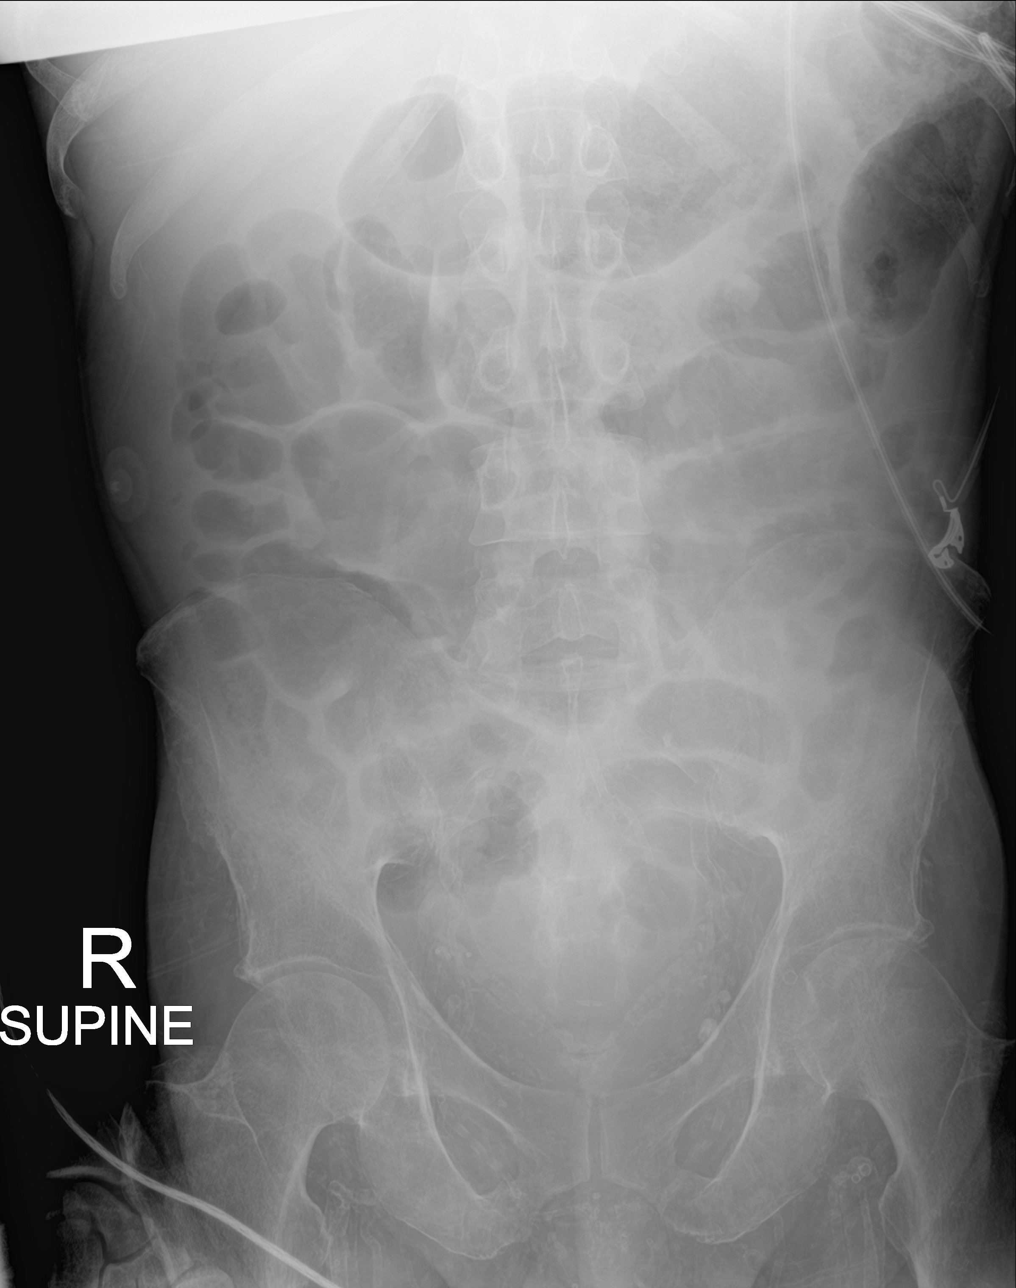

[3 of 3 positions shown; findings below may reference images not displayed]

FINDINGS: Right-sided dialysis catheter remains in place. Elevated right
hemidiaphragm with right pleural effusion. There is cardiomegaly and
vascular congestion.

Increased air throughout small and large bowel in a nonobstructive
pattern appears similar to prior exam. No free air. Small colonic
stool burden. Extensive vascular calcifications. The bones are under
mineralized.
IMPRESSION: 1. Cardiomegaly with right pleural effusion and vascular congestion.
2. No bowel obstruction or free air. Increased air throughout
nondilated bowel is similar to prior exams, enteritis could have
this appearance.

## 2018-12-07 IMAGING — CT CT HEAD W/O CM
3 of 4 series · 13 of 47 positions shown, 15 images · non-contrast
Comparison: None.

CLINICAL DATA: Altered level of consciousness. Dizziness leading to
fall.

EXAM:
CT HEAD WITHOUT CONTRAST
TECHNIQUE: Contiguous axial images were obtained from the base of the skull
through the vertex without intravenous contrast.

[Series 3: head wo · axial · 0.45mm/px · z∈[-56,+64]mm · 7 of 32 slices shown, 9 images]
[im 4/32  brain]
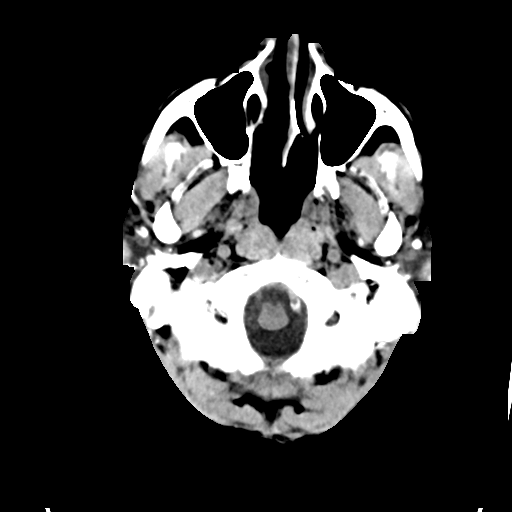
[im 4/32  bone]
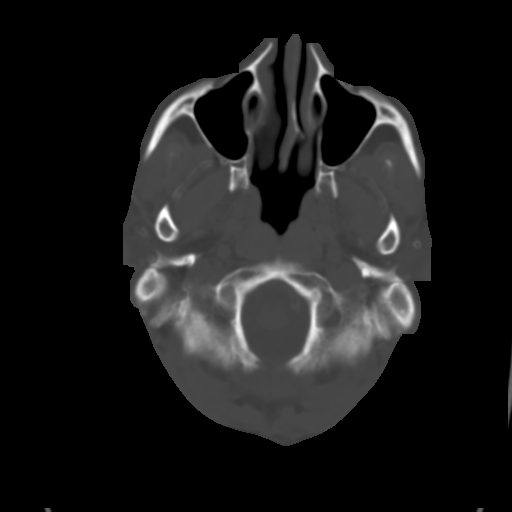
[im 8/32  brain]
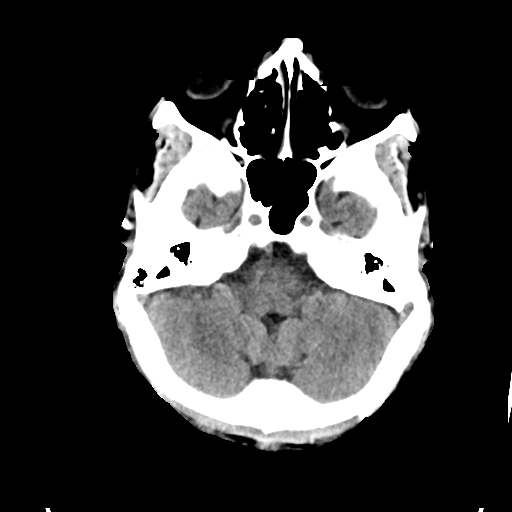
[im 12/32  brain]
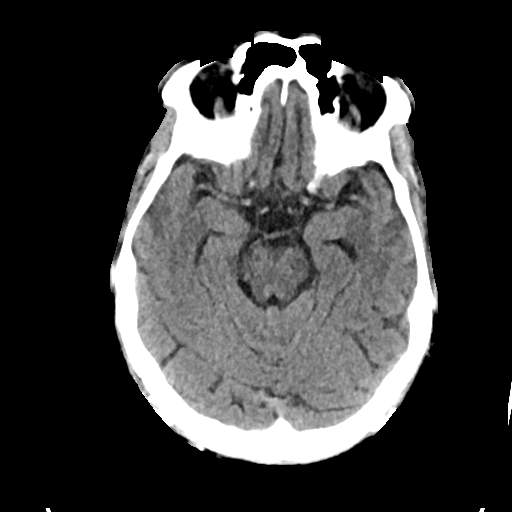
[im 16/32  brain]
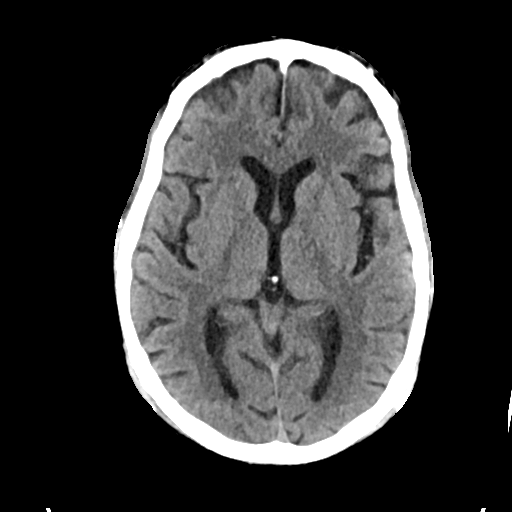
[im 20/32  brain]
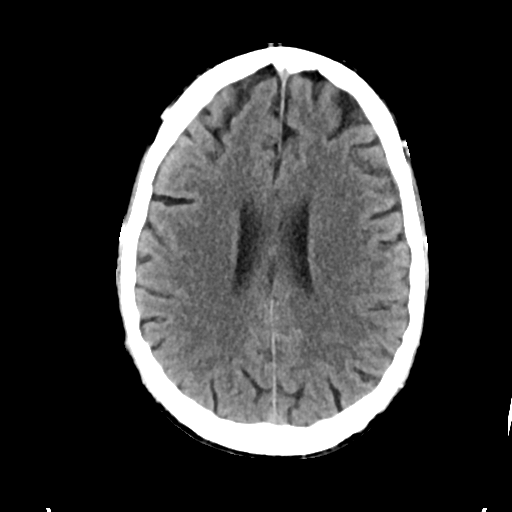
[im 20/32  bone]
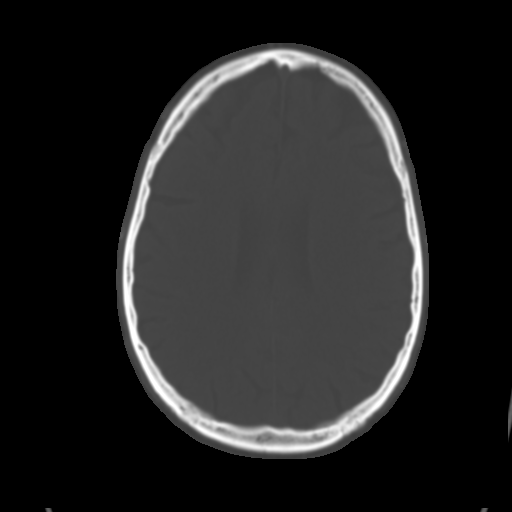
[im 24/32  brain]
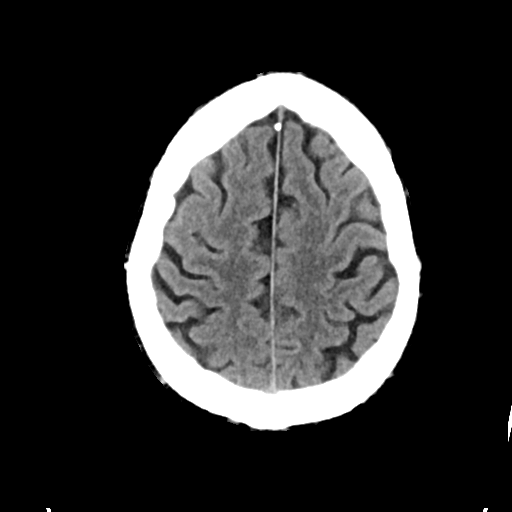
[im 28/32  brain]
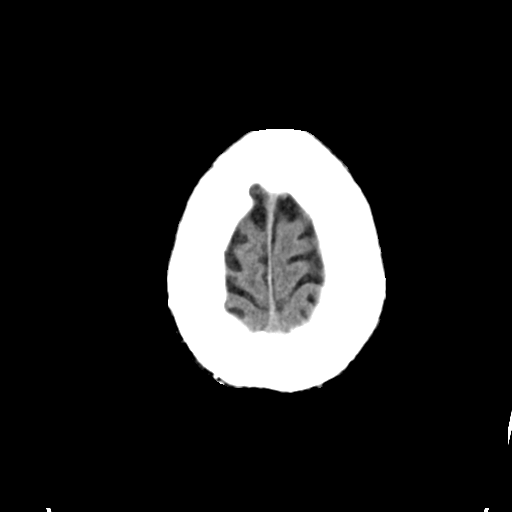

[Series 5: cor soft · coronal · 0.30mm/px · 3 of 76 slices shown]
[im 26/76  brain]
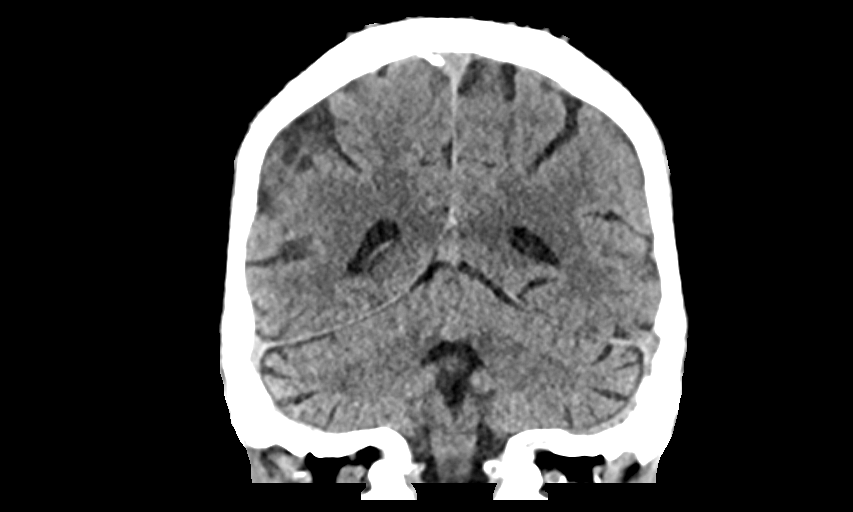
[im 34/76  brain]
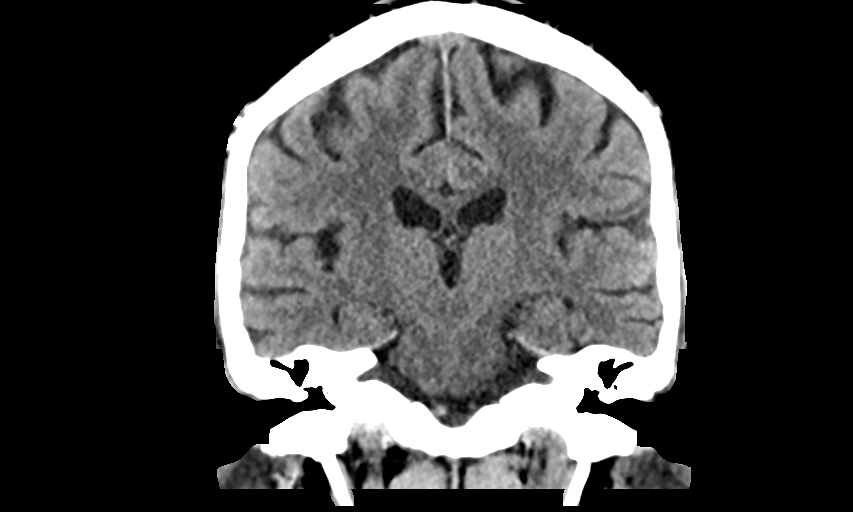
[im 42/76  brain]
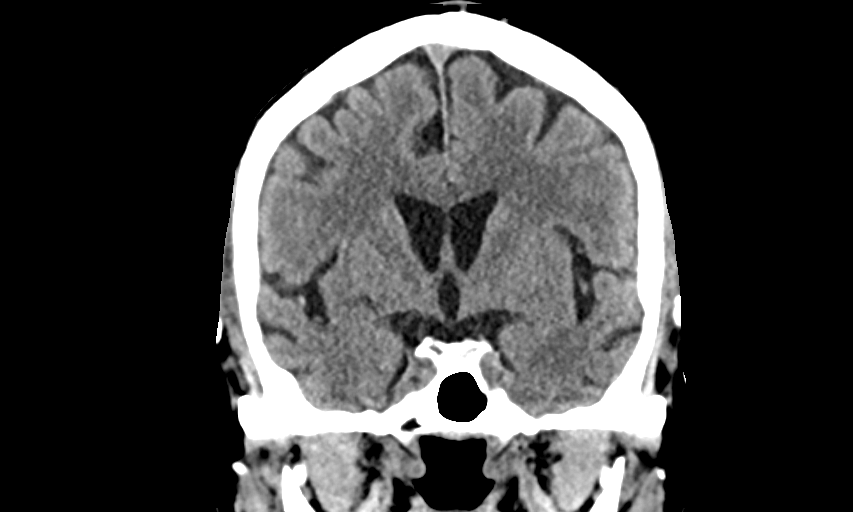

[Series 6: sag soft · sagittal · 0.30mm/px · 3 of 55 slices shown]
[im 19/55  brain]
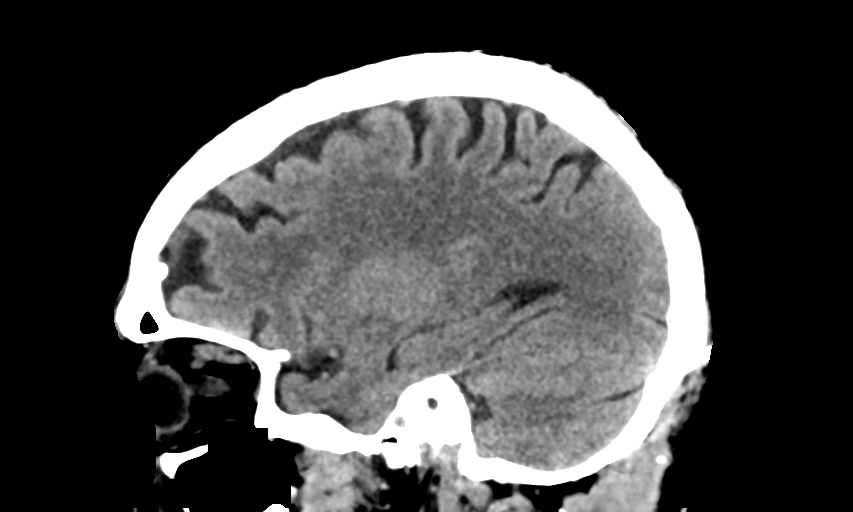
[im 28/55  brain]
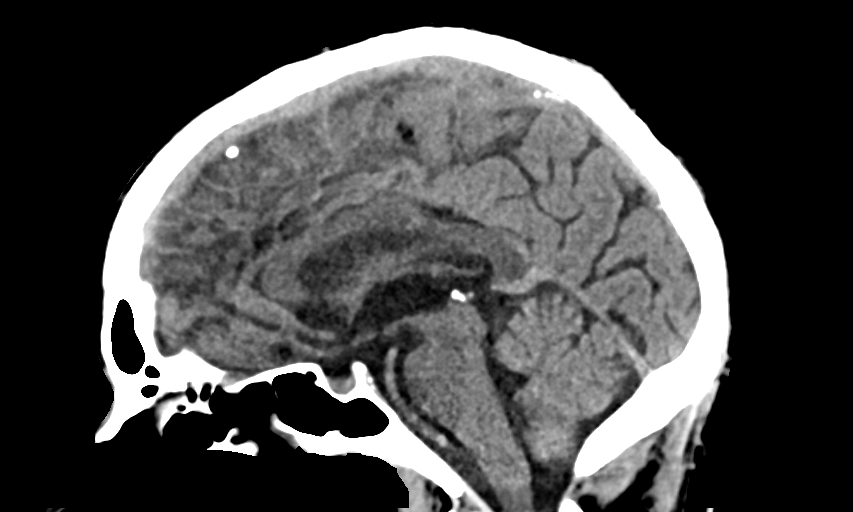
[im 37/55  brain]
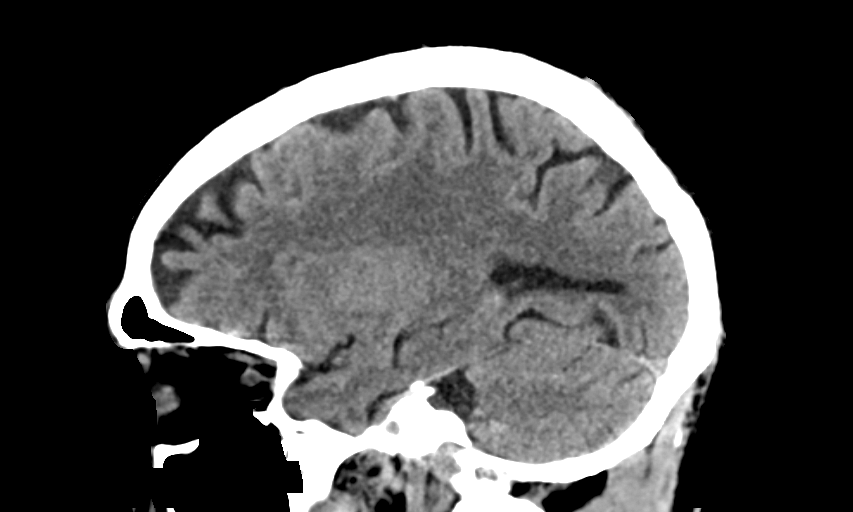

[13 of 47 positions shown; findings below may reference images not displayed]

FINDINGS: Brain: Mild generalized atrophy for age. No intracranial hemorrhage,
mass effect, or midline shift. No hydrocephalus. The basilar
cisterns are patent. No evidence of territorial infarct or acute
ischemia. No extra-axial or intracranial fluid collection.

Vascular: Atherosclerosis of skullbase vasculature without
hyperdense vessel or abnormal calcification.

Skull: No fracture or focal lesion.

Sinuses/Orbits: Paranasal sinuses and mastoid air cells are clear.
The visualized orbits are unremarkable. Probable cataract resection
on the left.

Other: Subcutaneous calcifications suggest diabetes.
IMPRESSION: No acute intracranial abnormality.

## 2018-12-08 IMAGING — DX DG CHEST 1V PORT
1 series · 1 of 1 positions shown · non-contrast
Comparison: 01/12/2017

CLINICAL DATA: 45-year-old male status post dialysis catheter
insertion.

EXAM:
PORTABLE CHEST 1 VIEW

[chest]
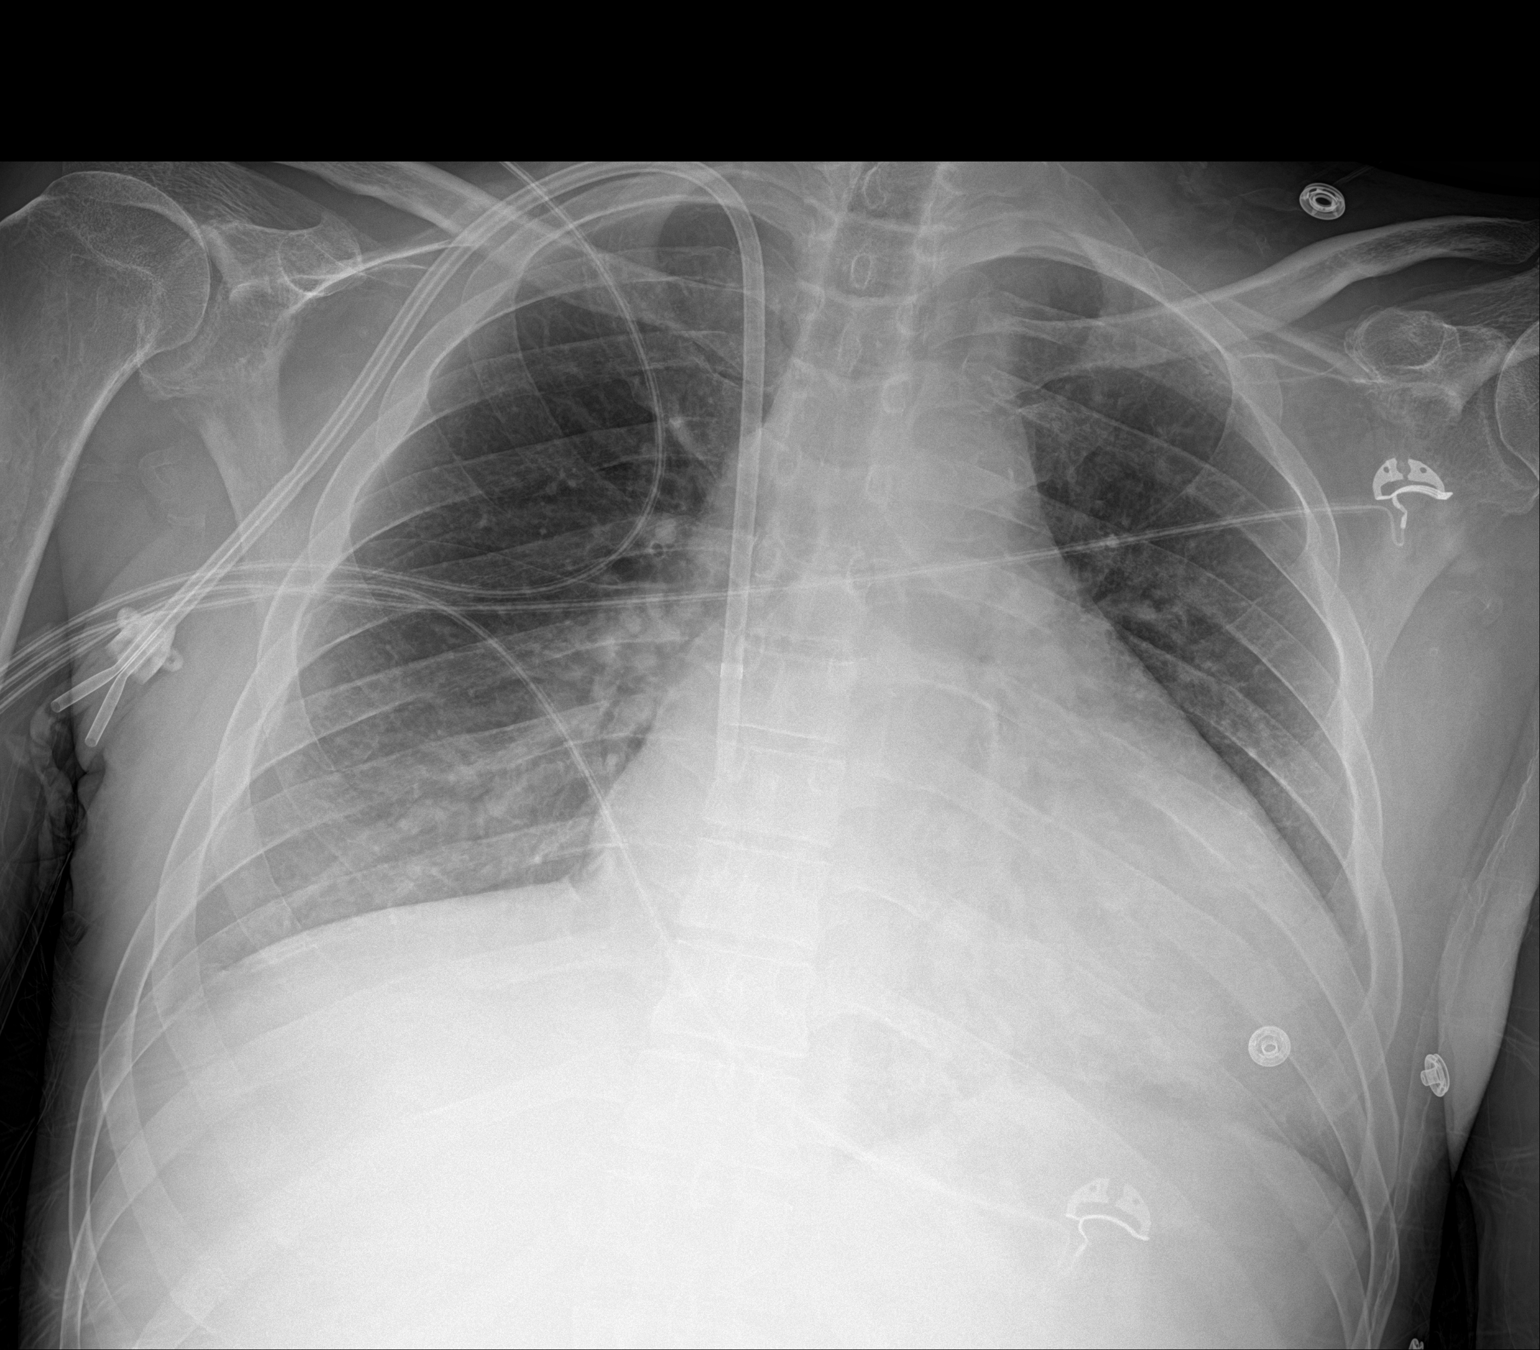

[1 of 1 positions shown; findings below may reference images not displayed]

FINDINGS: Right-sided dialysis catheter terminates in the right atrium. There
is stable cardiomegaly and pulmonary vascular congestion.

No focal parenchymal consolidation, sizable effusion or
pneumothorax.

No acute osseous abnormalities.
IMPRESSION: Right-sided dialysis catheter terminating in the right atrium.
Otherwise unchanged radiographic examination of the chest.
